# Patient Record
Sex: Male | Born: 1935 | Race: White | Hispanic: No | Smoking: Former smoker
Health system: Southern US, Community
[De-identification: ages and names within clinical notes are randomized; demographics above are authoritative.]

## PROBLEM LIST (undated history)

## (undated) DIAGNOSIS — I10 Essential (primary) hypertension: Secondary | ICD-10-CM

## (undated) DIAGNOSIS — F419 Anxiety disorder, unspecified: Secondary | ICD-10-CM

## (undated) DIAGNOSIS — C189 Malignant neoplasm of colon, unspecified: Secondary | ICD-10-CM

## (undated) DIAGNOSIS — E079 Disorder of thyroid, unspecified: Secondary | ICD-10-CM

## (undated) DIAGNOSIS — R600 Localized edema: Secondary | ICD-10-CM

## (undated) DIAGNOSIS — G4700 Insomnia, unspecified: Secondary | ICD-10-CM

## (undated) DIAGNOSIS — K5790 Diverticulosis of intestine, part unspecified, without perforation or abscess without bleeding: Secondary | ICD-10-CM

## (undated) HISTORY — PX: OTHER SURGICAL HISTORY: SHX169

---

## 2014-04-19 ENCOUNTER — Emergency Department (HOSPITAL_COMMUNITY): Payer: Medicare Other

## 2014-04-19 ENCOUNTER — Encounter (HOSPITAL_COMMUNITY): Payer: Self-pay | Admitting: Emergency Medicine

## 2014-04-19 ENCOUNTER — Emergency Department (HOSPITAL_COMMUNITY)
Admission: EM | Admit: 2014-04-19 | Discharge: 2014-04-19 | Disposition: A | Discharge status: Home / Self Care | Payer: Medicare Other | Attending: Emergency Medicine | Admitting: Emergency Medicine

## 2014-04-19 DIAGNOSIS — Z7982 Long term (current) use of aspirin: Secondary | ICD-10-CM | POA: Insufficient documentation

## 2014-04-19 DIAGNOSIS — Z87891 Personal history of nicotine dependence: Secondary | ICD-10-CM | POA: Insufficient documentation

## 2014-04-19 DIAGNOSIS — R1084 Generalized abdominal pain: Secondary | ICD-10-CM

## 2014-04-19 DIAGNOSIS — R109 Unspecified abdominal pain: Secondary | ICD-10-CM | POA: Diagnosis present

## 2014-04-19 DIAGNOSIS — F411 Generalized anxiety disorder: Secondary | ICD-10-CM | POA: Diagnosis not present

## 2014-04-19 DIAGNOSIS — I10 Essential (primary) hypertension: Secondary | ICD-10-CM | POA: Diagnosis not present

## 2014-04-19 DIAGNOSIS — I714 Abdominal aortic aneurysm, without rupture, unspecified: Secondary | ICD-10-CM | POA: Diagnosis not present

## 2014-04-19 DIAGNOSIS — Z8719 Personal history of other diseases of the digestive system: Secondary | ICD-10-CM | POA: Insufficient documentation

## 2014-04-19 DIAGNOSIS — Z85038 Personal history of other malignant neoplasm of large intestine: Secondary | ICD-10-CM | POA: Diagnosis not present

## 2014-04-19 DIAGNOSIS — E079 Disorder of thyroid, unspecified: Secondary | ICD-10-CM | POA: Insufficient documentation

## 2014-04-19 DIAGNOSIS — Z79899 Other long term (current) drug therapy: Secondary | ICD-10-CM | POA: Diagnosis not present

## 2014-04-19 HISTORY — DX: Disorder of thyroid, unspecified: E07.9

## 2014-04-19 HISTORY — DX: Insomnia, unspecified: G47.00

## 2014-04-19 HISTORY — DX: Essential (primary) hypertension: I10

## 2014-04-19 HISTORY — DX: Malignant neoplasm of colon, unspecified: C18.9

## 2014-04-19 HISTORY — DX: Diverticulosis of intestine, part unspecified, without perforation or abscess without bleeding: K57.90

## 2014-04-19 HISTORY — DX: Localized edema: R60.0

## 2014-04-19 HISTORY — DX: Anxiety disorder, unspecified: F41.9

## 2014-04-19 LAB — COMPREHENSIVE METABOLIC PANEL
ALK PHOS: 89 U/L (ref 39–117)
ALT: 14 U/L (ref 0–53)
AST: 19 U/L (ref 0–37)
Albumin: 3.3 g/dL — ABNORMAL LOW (ref 3.5–5.2)
Anion gap: 10 (ref 5–15)
BUN: 29 mg/dL — ABNORMAL HIGH (ref 6–23)
CHLORIDE: 104 meq/L (ref 96–112)
CO2: 25 mEq/L (ref 19–32)
Calcium: 9.3 mg/dL (ref 8.4–10.5)
Creatinine, Ser: 0.88 mg/dL (ref 0.50–1.35)
GFR calc non Af Amer: 74 mL/min — ABNORMAL LOW (ref >90)
GFR, EST AFRICAN AMERICAN: 86 mL/min — AB (ref >90)
GLUCOSE: 142 mg/dL — AB (ref 70–99)
Potassium: 4.2 mEq/L (ref 3.7–5.3)
Sodium: 139 mEq/L (ref 137–147)
Total Bilirubin: 0.4 mg/dL (ref 0.3–1.2)
Total Protein: 6.8 g/dL (ref 6.0–8.3)

## 2014-04-19 LAB — I-STAT CG4 LACTIC ACID, ED: Lactic Acid, Venous: 1.08 mmol/L (ref 0.5–2.2)

## 2014-04-19 LAB — URINALYSIS, ROUTINE W REFLEX MICROSCOPIC
BILIRUBIN URINE: NEGATIVE
GLUCOSE, UA: NEGATIVE mg/dL
Ketones, ur: NEGATIVE mg/dL
Leukocytes, UA: NEGATIVE
Nitrite: NEGATIVE
PROTEIN: NEGATIVE mg/dL
Specific Gravity, Urine: 1.041 — ABNORMAL HIGH (ref 1.005–1.030)
UROBILINOGEN UA: 0.2 mg/dL (ref 0.0–1.0)
pH: 7 (ref 5.0–8.0)

## 2014-04-19 LAB — CBC WITH DIFFERENTIAL/PLATELET
BASOS PCT: 0 % (ref 0–1)
Basophils Absolute: 0 10*3/uL (ref 0.0–0.1)
EOS PCT: 0 % (ref 0–5)
Eosinophils Absolute: 0 10*3/uL (ref 0.0–0.7)
HCT: 40.2 % (ref 39.0–52.0)
HEMOGLOBIN: 13.3 g/dL (ref 13.0–17.0)
Lymphocytes Relative: 7 % — ABNORMAL LOW (ref 12–46)
Lymphs Abs: 0.9 10*3/uL (ref 0.7–4.0)
MCH: 32 pg (ref 26.0–34.0)
MCHC: 33.1 g/dL (ref 30.0–36.0)
MCV: 96.6 fL (ref 78.0–100.0)
MONO ABS: 0.8 10*3/uL (ref 0.1–1.0)
MONOS PCT: 6 % (ref 3–12)
NEUTROS PCT: 87 % — AB (ref 43–77)
Neutro Abs: 10.9 10*3/uL — ABNORMAL HIGH (ref 1.7–7.7)
Platelets: ADEQUATE 10*3/uL (ref 150–400)
RBC: 4.16 MIL/uL — AB (ref 4.22–5.81)
RDW: 14.5 % (ref 11.5–15.5)
WBC: 12.6 10*3/uL — AB (ref 4.0–10.5)

## 2014-04-19 LAB — URINE MICROSCOPIC-ADD ON

## 2014-04-19 MED ORDER — IOHEXOL 350 MG/ML SOLN
100.0000 mL | Freq: Once | INTRAVENOUS | Status: AC | PRN
  Administered 2014-04-19: 100 mL via INTRAVENOUS

## 2014-04-19 NOTE — ED Notes (Signed)
Per EMS pt states he ate pizza for supper about 6pm and about an hour later he developed lower abd pain   Pt states he had a BM around 2130 with some relief  Pt tried to get himself to vomit but only had dry heaves  Pt is a resident at [facility] has hx of carcinoma of large intestine and diverticulosis

## 2014-04-19 NOTE — ED Notes (Signed)
Lactic Acid given to Dr. Dina Rich.

## 2014-04-19 NOTE — ED Provider Notes (Signed)
CSN: [id number]     Arrival date & time 04/19/14  0225 History   First MD Initiated Contact with Patient 04/19/14 [phone]     Chief Complaint  Patient presents with  . Abdominal Pain     (Consider location/radiation/quality/duration/timing/severity/associated sxs/prior Treatment) HPI  This is an 80 year old male with a history of hypertension, colon cancer, diverticulosis who presents with abdominal pain. Patient reports that he there as usual. He had pizza. Approximately one hour later he developed lower abdominal pain.  He describes as feeling gassy and bloated. Also describes as crampy. It is nonradiating. Currently he reports improvement of pain. He denies any vomiting or diarrhea. He has since had a bowel movement.  Patient reports he was recently diagnosed with a AAA but does not know how big and was supposed to have an ultrasound later today.  Past Medical History  Diagnosis Date  . Hypertension   . Anxiety   . Carcinoma of large intestine   . Diverticulosis   . Edema of foot   . Thyroid disease   . Insomnia    No past surgical history on file. No family history on file. History  Substance Use Topics  . Smoking status: Former Research scientist (life sciences)  . Smokeless tobacco: Not on file  . Alcohol Use: No    Review of Systems  Constitutional: Negative.  Negative for fever.  Respiratory: Negative.  Negative for chest tightness and shortness of breath.   Cardiovascular: Negative.  Negative for chest pain.  Gastrointestinal: Positive for abdominal pain. Negative for nausea, vomiting, diarrhea and constipation.  Genitourinary: Negative.  Negative for dysuria.  Musculoskeletal: Negative for back pain.  Neurological: Negative for headaches.  All other systems reviewed and are negative.     Allergies  Demerol  Home Medications   Prior to Admission medications   Medication Sig Start Date End Date Taking? Authorizing Provider  acetaminophen (TYLENOL) 500 MG tablet Take 1,000 mg by mouth 2  (two) times daily.   Yes Historical Provider, MD  amLODipine (NORVASC) 5 MG tablet Take 5 mg by mouth daily.   Yes Historical Provider, MD  aspirin EC 81 MG tablet Take 81 mg by mouth daily.   Yes Historical Provider, MD  busPIRone (BUSPAR) 10 MG tablet Take 20 mg by mouth every 8 (eight) hours.   Yes Historical Provider, MD  escitalopram (LEXAPRO) 20 MG tablet Take 20 mg by mouth daily.   Yes Historical Provider, MD  levothyroxine (SYNTHROID, LEVOTHROID) 25 MCG tablet Take 25 mcg by mouth daily before breakfast.   Yes Historical Provider, MD  tamsulosin (FLOMAX) 0.4 MG CAPS capsule Take 0.4 mg by mouth daily.   Yes Historical Provider, MD   BP 142/80  Pulse 72  Temp(Src) 98.1 F (36.7 C) (Oral)  Resp 18  SpO2 94% Physical Exam  Nursing note and vitals reviewed. Constitutional: He is oriented to person, place, and time. No distress.  Elderly  HENT:  Head: Normocephalic and atraumatic.  Eyes: Pupils are equal, round, and reactive to light.  Neck: Neck supple.  Cardiovascular: Normal rate, regular rhythm and normal heart sounds.   No murmur heard. Pulmonary/Chest: Effort normal and breath sounds normal. No respiratory distress. He has no wheezes.  Abdominal: Soft. Bowel sounds are normal. There is tenderness. There is no rebound and no guarding.  old midline abdominal scar  Musculoskeletal: He exhibits no edema.  Neurological: He is alert and oriented to person, place, and time.  Skin: Skin is warm and dry.  Psychiatric: He has  a normal mood and affect.    ED Course  Procedures (including critical care time) Labs Review Labs Reviewed  CBC WITH DIFFERENTIAL - Abnormal; Notable for the following:    WBC 12.6 (*)    RBC 4.16 (*)    Neutrophils Relative % 87 (*)    Lymphocytes Relative 7 (*)    Neutro Abs 10.9 (*)    All other components within normal limits  COMPREHENSIVE METABOLIC PANEL - Abnormal; Notable for the following:    Glucose, Bld 142 (*)    BUN 29 (*)    Albumin  3.3 (*)    GFR calc non Af Amer 74 (*)    GFR calc Af Amer 86 (*)    All other components within normal limits  URINALYSIS, ROUTINE W REFLEX MICROSCOPIC - Abnormal; Notable for the following:    Specific Gravity, Urine 1.041 (*)    Hgb urine dipstick TRACE (*)    All other components within normal limits  URINE MICROSCOPIC-ADD ON  I-STAT CG4 LACTIC ACID, ED    Imaging Review Ct Cta Abd/pel W/cm &/or W/o Cm  04/19/2014   CLINICAL DATA:  Abdominal pain. History of abdominal aortic aneurysm.  EXAM: CTA ABDOMEN AND PELVIS wITHOUT AND WITH CONTRAST  TECHNIQUE: Multidetector CT imaging of the abdomen and pelvis was performed using the standard protocol during bolus administration of intravenous contrast. Multiplanar reconstructed images and MIPs were obtained and reviewed to evaluate the vascular anatomy.  CONTRAST:  181mL OMNIPAQUE IOHEXOL 350 MG/ML SOLN  COMPARISON:  None.  FINDINGS: BODY WALL: Unremarkable.  LOWER CHEST: Diffuse coronary atherosclerosis. There is marked elevation of the left diaphragm. The left lower lung is not visualized.  ABDOMEN/PELVIS:  Liver: Low-density areas scattered throughout the liver appear benign/cystic, measuring up to 17 mm in the ventral right liver.  Biliary: Prominent upper common bile duct, measuring up to 13 mm. There is no evidence of calcified stone or acute cholecystitis.  Pancreas: Imaged portions unremarkable  Spleen: Partial visualization due to displacement. Imaged portions unremarkable.  Adrenals: Not definitively visualized on the left. The right adrenal is unremarkable.  Kidneys and ureters: The left kidney is rotated and superiorly displaced. There are multiple cysts within the kidney, including a nearly 10 cm cyst exophytic from the lower pole. No hydronephrosis. Multiple right renal cysts as well.  Bladder: Moderate distension of the bladder, which is deviated to the right by lipomatosis mass.  Reproductive: Enlarged prostate with heterogeneous nodular  enhancement. Findings are most suggestive of hypertrophy.  Bowel: The hepatic flexure is not entirely visualized due to displacement. There has been right hemicolectomy reportedly for colon cancer. No evidence of bowel obstruction. Extensive distal colonic diverticulosis. The sigmoid colon and transverse colon are displaced body lipomatosis mass. Appendix is surgically absent.  Retroperitoneum: There is a large fatty mass extending from the level of the imaged left upper quadrant (not entirely visualized due to displaced by the diaphragm) into the pelvis. There is displacement of vessels and internal and septations and occasional nodularity. Fat haziness present in the pelvis, left of the bladder, consistent with soft tissue components. There is likely contralateral extension in the right lower quadrant.  Peritoneum: No ascites or pneumoperitoneum.  Vascular: Juxtarenal abdominal aortic aneurysm which measures up to 48 mm maximal transverse diameter, by 43 mm AP dimension. There is extensive mural thrombus and mural atherosclerotic calcification with probable multiple chronic dissections based on displaced intimal calcification. There is a web-like, complex, moderate to high-grade stenosis involving the proximal left  common iliac artery which is enlarged at 16 mm. Along the posterior margin of the aneurysm is a crescent of high density. There is no draping of the aorta or rupture.  OSSEOUS: L3 compression fracture with greater than 75% height loss. There is moderate retropulsion with canal narrowing. The fracture is unhealed.  Review of the MIP images confirms the above findings.  IMPRESSION: 1. 4.8 cm non-ruptured juxtarenal aortic aneurysm. High-density in the posterior aneurysm wall could be mural thickening or intramural hematoma. 2. Extensive retroperitoneal liposarcoma. 3. L3 compression fracture with progressive height loss and retropulsion from 03/15/2014. 4. Multiple incidental findings noted above.    Electronically Signed   By: Jorje Guild M.D.   On: 04/19/2014 05:54     EKG Interpretation None      MDM   Final diagnoses:  Generalized abdominal pain  AAA (abdominal aortic aneurysm) without rupture    Patient presents with postprandial abdominal pain. Currently is comfortable. Vital signs are reassuring. No evidence of peritonitis on exam. Patient reports also a history of AAA for which he is supposed to have her ultrasound later today. Basic labwork obtained. Lab work is largely reassuring including normal lactate. However, given postprandial pain and AAA, we'll get a CTA to evaluate for both mesenteric ischemia and AAA. Patient has a 4.8 cm nonruptured AAA.  No evidence of mesenteric artery occlusion. Patient continues to have a benign exam and denies any pain. Given reassuring workup, feel patient can be discharged home. He is followup with his vascular surgeon later today. Patient was given CT report. I encouraged the patient to call his vascular surgeon prior to obtaining ultrasound later today as he has had a CT scan. Patient stated understanding.  After history, exam, and medical workup I feel the patient has been appropriately medically screened and is safe for discharge home. Pertinent diagnoses were discussed with the patient. Patient was given return precautions.     Merryl Hacker, MD 04/19/14 [phone]

## 2014-04-19 NOTE — ED Notes (Signed)
Pt arrived to the ED with a complaint of abdominal pain located midline medial.  Pt states the pain began yesterday after eating pizza.  Pt states that he usually doesn't eat pizza.  Pt denies emesis but does state he had dry heaves.  Pt doesn't have nausea but slight abdominal pain that was relieved after he took Mylanta and a sprite.

## 2014-04-19 NOTE — Discharge Instructions (Signed)
Abdominal Aortic Aneurysm An aneurysm is a weakened or damaged part of an artery wall that bulges from the normal force of blood pumping through the body. An abdominal aortic aneurysm is an aneurysm that occurs in the lower part of the aorta, the main artery of the body.  The major concern with an abdominal aortic aneurysm is that it can enlarge and burst (rupture) or blood can flow between the layers of the wall of the aorta through a tear (aorticdissection). Both of these conditions can cause bleeding inside the body and can be life threatening unless diagnosed and treated promptly. CAUSES  The exact cause of an abdominal aortic aneurysm is unknown. Some contributing factors are:   A hardening of the arteries caused by the buildup of fat and other substances in the lining of a blood vessel (arteriosclerosis).  Inflammation of the walls of an artery (arteritis).   Connective tissue diseases, such as Marfan syndrome.   Abdominal trauma.   An infection, such as syphilis or staphylococcus, in the wall of the aorta (infectious aortitis) caused by bacteria. RISK FACTORS  Risk factors that contribute to an abdominal aortic aneurysm may include:  Age older than 60 years.   High blood pressure (hypertension).  Male gender.  Ethnicity (white race).  Obesity.  Family history of aneurysm (first degree relatives only).  Tobacco use. PREVENTION  The following healthy lifestyle habits may help decrease your risk of abdominal aortic aneurysm:  Quitting smoking. Smoking can raise your blood pressure and cause arteriosclerosis.  Limiting or avoiding alcohol.  Keeping your blood pressure, blood sugar level, and cholesterol levels within normal limits.  Decreasing your salt intake. In somepeople, too much salt can raise blood pressure and increase your risk of abdominal aortic aneurysm.  Eating a diet low in saturated fats and cholesterol.  Increasing your fiber intake by including  whole grains, vegetables, and fruits in your diet. Eating these foods may help lower blood pressure.  Maintaining a healthy weight.  Staying physically active and exercising regularly. SYMPTOMS  The symptoms of abdominal aortic aneurysm may vary depending on the size and rate of growth of the aneurysm.Most grow slowly and do not have any symptoms. When symptoms do occur, they may include:  Pain (abdomen, side, lower back, or groin). The pain may vary in intensity. A sudden onset of severe pain may indicate that the aneurysm has ruptured.  Feeling full after eating only small amounts of food.  Nausea or vomiting or both.  Feeling a pulsating lump in the abdomen.  Feeling faint or passing out. DIAGNOSIS  Since most unruptured abdominal aortic aneurysms have no symptoms, they are often discovered during diagnostic exams for other conditions. An aneurysm may be found during the following procedures:  Ultrasonography (A one-time screening for abdominal aortic aneurysm by ultrasonography is also recommended for all men aged 65-75 years who have ever smoked).  X-ray exams.  A computed tomography (CT).  Magnetic resonance imaging (MRI).  Angiography or arteriography. TREATMENT  Treatment of an abdominal aortic aneurysm depends on the size of your aneurysm, your age, and risk factors for rupture. Medication to control blood pressure and pain may be used to manage aneurysms smaller than 6 cm. Regular monitoring for enlargement may be recommended by your caregiver if:  The aneurysm is 3-4 cm in size (an annual ultrasonography may be recommended).  The aneurysm is 4-4.5 cm in size (an ultrasonography every 6 months may be recommended).  The aneurysm is larger than 4.5 cm in   size (your caregiver may ask that you be examined by a vascular surgeon). If your aneurysm is larger than 6 cm, surgical repair may be recommended. There are two main methods for repair of an aneurysm:   Endovascular  repair (a minimally invasive surgery). This is done most often.  Open repair. This method is used if an endovascular repair is not possible. Document Released: 04/23/2005 Document Revised: 11/08/2012 Document Reviewed: 08/13/2012 Exit[employer] Patient Information 2015 [employer]. This information is not intended to replace advice given to you by your health care provider. Make sure you discuss any questions you have with your health care provider.  

## 2014-06-23 ENCOUNTER — Emergency Department (HOSPITAL_COMMUNITY): Payer: Medicare Other

## 2014-06-23 ENCOUNTER — Encounter (HOSPITAL_COMMUNITY): Payer: Self-pay | Admitting: Emergency Medicine

## 2014-06-23 ENCOUNTER — Inpatient Hospital Stay (HOSPITAL_COMMUNITY)
Admission: EM | Admit: 2014-06-23 | Discharge: 2014-06-26 | DRG: 071 | Disposition: A | Discharge status: Home / Self Care | Payer: Medicare Other | Attending: Internal Medicine | Admitting: Internal Medicine

## 2014-06-23 DIAGNOSIS — R410 Disorientation, unspecified: Secondary | ICD-10-CM | POA: Diagnosis present

## 2014-06-23 DIAGNOSIS — F329 Major depressive disorder, single episode, unspecified: Secondary | ICD-10-CM

## 2014-06-23 DIAGNOSIS — G9340 Encephalopathy, unspecified: Secondary | ICD-10-CM | POA: Diagnosis present

## 2014-06-23 DIAGNOSIS — F419 Anxiety disorder, unspecified: Secondary | ICD-10-CM | POA: Diagnosis present

## 2014-06-23 DIAGNOSIS — R630 Anorexia: Secondary | ICD-10-CM | POA: Diagnosis not present

## 2014-06-23 DIAGNOSIS — Z888 Allergy status to other drugs, medicaments and biological substances status: Secondary | ICD-10-CM

## 2014-06-23 DIAGNOSIS — C480 Malignant neoplasm of retroperitoneum: Secondary | ICD-10-CM | POA: Diagnosis not present

## 2014-06-23 DIAGNOSIS — F0390 Unspecified dementia without behavioral disturbance: Secondary | ICD-10-CM | POA: Diagnosis present

## 2014-06-23 DIAGNOSIS — Z9049 Acquired absence of other specified parts of digestive tract: Secondary | ICD-10-CM | POA: Diagnosis present

## 2014-06-23 DIAGNOSIS — K5790 Diverticulosis of intestine, part unspecified, without perforation or abscess without bleeding: Secondary | ICD-10-CM | POA: Diagnosis not present

## 2014-06-23 DIAGNOSIS — G4700 Insomnia, unspecified: Secondary | ICD-10-CM | POA: Diagnosis present

## 2014-06-23 DIAGNOSIS — Z681 Body mass index (BMI) 19 or less, adult: Secondary | ICD-10-CM | POA: Diagnosis not present

## 2014-06-23 DIAGNOSIS — Z7982 Long term (current) use of aspirin: Secondary | ICD-10-CM | POA: Diagnosis not present

## 2014-06-23 DIAGNOSIS — E039 Hypothyroidism, unspecified: Secondary | ICD-10-CM | POA: Diagnosis not present

## 2014-06-23 DIAGNOSIS — Z515 Encounter for palliative care: Secondary | ICD-10-CM | POA: Diagnosis not present

## 2014-06-23 DIAGNOSIS — R4182 Altered mental status, unspecified: Secondary | ICD-10-CM | POA: Diagnosis present

## 2014-06-23 DIAGNOSIS — I10 Essential (primary) hypertension: Secondary | ICD-10-CM | POA: Diagnosis not present

## 2014-06-23 DIAGNOSIS — F32A Depression, unspecified: Secondary | ICD-10-CM | POA: Insufficient documentation

## 2014-06-23 DIAGNOSIS — Z66 Do not resuscitate: Secondary | ICD-10-CM | POA: Diagnosis present

## 2014-06-23 LAB — CBC WITH DIFFERENTIAL/PLATELET
Basophils Absolute: 0 10*3/uL (ref 0.0–0.1)
Basophils Relative: 0 % (ref 0–1)
Eosinophils Absolute: 0.1 10*3/uL (ref 0.0–0.7)
Eosinophils Relative: 1 % (ref 0–5)
HCT: 37.9 % — ABNORMAL LOW (ref 39.0–52.0)
HEMOGLOBIN: 12.3 g/dL — AB (ref 13.0–17.0)
LYMPHS ABS: 1.1 10*3/uL (ref 0.7–4.0)
Lymphocytes Relative: 14 % (ref 12–46)
MCH: 31.8 pg (ref 26.0–34.0)
MCHC: 32.5 g/dL (ref 30.0–36.0)
MCV: 97.9 fL (ref 78.0–100.0)
MONO ABS: 0.8 10*3/uL (ref 0.1–1.0)
MONOS PCT: 10 % (ref 3–12)
NEUTROS ABS: 5.8 10*3/uL (ref 1.7–7.7)
Neutrophils Relative %: 75 % (ref 43–77)
Platelets: 418 10*3/uL — ABNORMAL HIGH (ref 150–400)
RBC: 3.87 MIL/uL — AB (ref 4.22–5.81)
RDW: 14.1 % (ref 11.5–15.5)
WBC: 7.8 10*3/uL (ref 4.0–10.5)

## 2014-06-23 LAB — COMPREHENSIVE METABOLIC PANEL
ALBUMIN: 2.9 g/dL — AB (ref 3.5–5.2)
ALK PHOS: 100 U/L (ref 39–117)
ALT: 21 U/L (ref 0–53)
AST: 25 U/L (ref 0–37)
Anion gap: 9 (ref 5–15)
BILIRUBIN TOTAL: 0.3 mg/dL (ref 0.3–1.2)
BUN: 23 mg/dL (ref 6–23)
CHLORIDE: 101 meq/L (ref 96–112)
CO2: 29 mEq/L (ref 19–32)
CREATININE: 0.85 mg/dL (ref 0.50–1.35)
Calcium: 8.9 mg/dL (ref 8.4–10.5)
GFR, EST AFRICAN AMERICAN: 88 mL/min — AB (ref >90)
GFR, EST NON AFRICAN AMERICAN: 76 mL/min — AB (ref >90)
GLUCOSE: 98 mg/dL (ref 70–99)
POTASSIUM: 4 meq/L (ref 3.7–5.3)
Sodium: 139 mEq/L (ref 137–147)
Total Protein: 6.6 g/dL (ref 6.0–8.3)

## 2014-06-23 LAB — URINALYSIS, ROUTINE W REFLEX MICROSCOPIC
BILIRUBIN URINE: NEGATIVE
Glucose, UA: NEGATIVE mg/dL
HGB URINE DIPSTICK: NEGATIVE
KETONES UR: NEGATIVE mg/dL
LEUKOCYTES UA: NEGATIVE
Nitrite: NEGATIVE
PH: 7 (ref 5.0–8.0)
PROTEIN: NEGATIVE mg/dL
Specific Gravity, Urine: 1.02 (ref 1.005–1.030)
Urobilinogen, UA: 1 mg/dL (ref 0.0–1.0)

## 2014-06-23 LAB — I-STAT TROPONIN, ED: TROPONIN I, POC: 0.01 ng/mL (ref 0.00–0.08)

## 2014-06-23 LAB — AMMONIA: AMMONIA: 21 umol/L (ref 11–60)

## 2014-06-23 LAB — I-STAT CG4 LACTIC ACID, ED: Lactic Acid, Venous: 0.56 mmol/L (ref 0.5–2.2)

## 2014-06-23 LAB — TSH: TSH: 0.924 u[IU]/mL (ref 0.350–4.500)

## 2014-06-23 MED ORDER — MIRTAZAPINE 7.5 MG PO TABS
7.5000 mg | ORAL_TABLET | Freq: Every day | ORAL | Status: DC
  Administered 2014-06-23 – 2014-06-25 (×3): 7.5 mg via ORAL
  Filled 2014-06-23 (×4): qty 1

## 2014-06-23 MED ORDER — SENNA 8.6 MG PO TABS: 8.6000 mg | ORAL_TABLET | Freq: Every day | ORAL | Status: DC | PRN

## 2014-06-23 MED ORDER — TAMSULOSIN HCL 0.4 MG PO CAPS
0.4000 mg | ORAL_CAPSULE | Freq: Every day | ORAL | Status: DC
  Administered 2014-06-23 – 2014-06-26 (×3): 0.4 mg via ORAL
  Filled 2014-06-23 (×4): qty 1

## 2014-06-23 MED ORDER — ESCITALOPRAM OXALATE 20 MG PO TABS
20.0000 mg | ORAL_TABLET | Freq: Every day | ORAL | Status: DC
  Administered 2014-06-23 – 2014-06-26 (×3): 20 mg via ORAL
  Filled 2014-06-23 (×4): qty 1

## 2014-06-23 MED ORDER — AMLODIPINE BESYLATE 5 MG PO TABS
5.0000 mg | ORAL_TABLET | Freq: Every day | ORAL | Status: DC
  Administered 2014-06-23 – 2014-06-26 (×3): 5 mg via ORAL
  Filled 2014-06-23 (×4): qty 1

## 2014-06-23 MED ORDER — SODIUM CHLORIDE 0.9 % IV SOLN
Freq: Once | INTRAVENOUS | Status: AC
  Administered 2014-06-23: 04:00:00 via INTRAVENOUS

## 2014-06-23 MED ORDER — SODIUM CHLORIDE 0.9 % IV BOLUS (SEPSIS)
1000.0000 mL | Freq: Once | INTRAVENOUS | Status: AC
  Administered 2014-06-23: 1000 mL via INTRAVENOUS

## 2014-06-23 MED ORDER — SODIUM CHLORIDE 0.9 % IV SOLN
INTRAVENOUS | Status: AC
  Administered 2014-06-23: 05:00:00 via INTRAVENOUS

## 2014-06-23 MED ORDER — ONDANSETRON HCL 4 MG/2ML IJ SOLN: 4.0000 mg | Freq: Four times a day (QID) | INTRAMUSCULAR | Status: DC | PRN

## 2014-06-23 MED ORDER — ACETAMINOPHEN 325 MG PO TABS: 650.0000 mg | ORAL_TABLET | Freq: Four times a day (QID) | ORAL | Status: DC | PRN

## 2014-06-23 MED ORDER — ENSURE PLUS PO LIQD: 237.0000 mL | Freq: Two times a day (BID) | ORAL | Status: DC

## 2014-06-23 MED ORDER — LEVOTHYROXINE SODIUM 25 MCG PO TABS
25.0000 ug | ORAL_TABLET | Freq: Every day | ORAL | Status: DC
  Administered 2014-06-23 – 2014-06-26 (×3): 25 ug via ORAL
  Filled 2014-06-23 (×5): qty 1

## 2014-06-23 MED ORDER — BUSPIRONE HCL 10 MG PO TABS
20.0000 mg | ORAL_TABLET | Freq: Three times a day (TID) | ORAL | Status: DC
Start: 2014-06-23 — End: 2014-06-26
  Administered 2014-06-23 – 2014-06-25 (×5): 20 mg via ORAL
  Filled 2014-06-23 (×14): qty 2

## 2014-06-23 MED ORDER — SODIUM CHLORIDE 0.9 % IV SOLN
INTRAVENOUS | Status: AC
  Administered 2014-06-23: 20:00:00 via INTRAVENOUS

## 2014-06-23 MED ORDER — ACETAMINOPHEN 650 MG RE SUPP: 650.0000 mg | Freq: Four times a day (QID) | RECTAL | Status: DC | PRN

## 2014-06-23 MED ORDER — ENOXAPARIN SODIUM 40 MG/0.4ML ~~LOC~~ SOLN
40.0000 mg | SUBCUTANEOUS | Status: DC
  Administered 2014-06-23 – 2014-06-26 (×3): 40 mg via SUBCUTANEOUS
  Filled 2014-06-23 (×5): qty 0.4

## 2014-06-23 MED ORDER — ONDANSETRON HCL 4 MG PO TABS: 4.0000 mg | ORAL_TABLET | Freq: Four times a day (QID) | ORAL | Status: DC | PRN

## 2014-06-23 MED ORDER — TRAMADOL HCL 50 MG PO TABS
50.0000 mg | ORAL_TABLET | Freq: Four times a day (QID) | ORAL | Status: DC | PRN
  Administered 2014-06-23 – 2014-06-25 (×2): 50 mg via ORAL
  Filled 2014-06-23 (×2): qty 1

## 2014-06-23 MED ORDER — ASPIRIN EC 81 MG PO TBEC
81.0000 mg | DELAYED_RELEASE_TABLET | Freq: Every day | ORAL | Status: DC
  Administered 2014-06-23 – 2014-06-26 (×3): 81 mg via ORAL
  Filled 2014-06-23 (×4): qty 1

## 2014-06-23 MED ORDER — ENSURE COMPLETE PO LIQD
237.0000 mL | Freq: Two times a day (BID) | ORAL | Status: DC
  Administered 2014-06-23 – 2014-06-26 (×4): 237 mL via ORAL

## 2014-06-23 NOTE — ED Provider Notes (Signed)
CSN: [id number]     Arrival date & time 06/23/14  0022 History   First MD Initiated Contact with Patient 06/23/14 0029     Chief Complaint  Patient presents with  . Altered Mental Status  . Failure To Thrive     (Consider location/radiation/quality/duration/timing/severity/associated sxs/prior Treatment) HPI Ricky Todd is a 80 y.o. male with history of hypertension, anxiety, colon cancer which patient is not aware of (family decided to keep a secret), thyroid disease, presents to emergency department brought in by his family for altered mental status. According to family, patient has had progressive decline in his activity level and mental status. Today however patient's sister states that he is more confused than usual, has a new tremor, and just not acting like himself. Episodes are intermittent throughout the day. Patient did have many stressors recently, his wife was admitted to nursing home yesterday. Patient currently lives in independent living facility. Family denies any recent illnesses. Patient has no complaint. Family states the patient was saying that something was wrong earlier, but he could not explain what it was, and he was asking for help.  Past Medical History  Diagnosis Date  . Hypertension   . Anxiety   . Carcinoma of large intestine   . Diverticulosis   . Edema of foot   . Thyroid disease   . Insomnia    History reviewed. No pertinent past surgical history. No family history on file. History  Substance Use Topics  . Smoking status: Former Research scientist (life sciences)  . Smokeless tobacco: Not on file  . Alcohol Use: No    Review of Systems  Unable to perform ROS: Mental status change      Allergies  Demerol  Home Medications   Prior to Admission medications   Medication Sig Start Date End Date Taking? Authorizing Provider  acetaminophen (TYLENOL) 500 MG tablet Take 1,000 mg by mouth 2 (two) times daily.   Yes Historical Provider, MD  amLODipine (NORVASC) 5 MG tablet  Take 5 mg by mouth daily.   Yes Historical Provider, MD  busPIRone (BUSPAR) 10 MG tablet Take 20 mg by mouth every 8 (eight) hours.   Yes Historical Provider, MD  ENSURE PLUS (ENSURE PLUS) LIQD Take 237 mLs by mouth 2 (two) times daily between meals.   Yes Historical Provider, MD  escitalopram (LEXAPRO) 20 MG tablet Take 20 mg by mouth daily.   Yes Historical Provider, MD  levothyroxine (SYNTHROID, LEVOTHROID) 25 MCG tablet Take 25 mcg by mouth daily before breakfast.   Yes Historical Provider, MD  senna (SENOKOT) 8.6 MG tablet Take 1 tablet by mouth daily as needed for constipation.   Yes Historical Provider, MD  tamsulosin (FLOMAX) 0.4 MG CAPS capsule Take 0.4 mg by mouth daily.   Yes Historical Provider, MD  traMADol (ULTRAM) 50 MG tablet Take 50 mg by mouth 4 (four) times daily as needed for moderate pain.   Yes Historical Provider, MD  aspirin EC 81 MG tablet Take 81 mg by mouth daily.    Historical Provider, MD   BP 140/88 mmHg  Pulse 89  Temp(Src) 98.4 F (36.9 C) (Rectal)  Resp 22  SpO2 94% Physical Exam  Constitutional: He appears well-developed and well-nourished. No distress.  HENT:  Head: Normocephalic and atraumatic.  Oral mucosa dry  Eyes:  Pupils are pinpoint, purulent bilateral eye drainage noted.  Neck: Neck supple.  Cardiovascular: Normal rate and regular rhythm.   Murmur heard. Pulmonary/Chest: Effort normal. No respiratory distress. He has no wheezes. He  has no rales.  Abdominal: Soft. Bowel sounds are normal. He exhibits no distension. There is no tenderness. There is no rebound.  Musculoskeletal: He exhibits no edema.  Neurological: He is alert. No cranial nerve deficit. Coordination normal.  Oriented to self and place. 5/5 and equal upper and lower extremity strength bilaterally. Equal grip strength bilaterally. Normal finger to nose and heel to shin. No pronator drift.  Skin: Skin is warm and dry.  Nursing note and vitals reviewed.   ED Course  Procedures  (including critical care time) Labs Review Labs Reviewed  CBC WITH DIFFERENTIAL - Abnormal; Notable for the following:    RBC 3.87 (*)    Hemoglobin 12.3 (*)    HCT 37.9 (*)    Platelets 418 (*)    All other components within normal limits  COMPREHENSIVE METABOLIC PANEL - Abnormal; Notable for the following:    Albumin 2.9 (*)    GFR calc non Af Amer 76 (*)    GFR calc Af Amer 88 (*)    All other components within normal limits  URINALYSIS, ROUTINE W REFLEX MICROSCOPIC - Abnormal; Notable for the following:    APPearance CLOUDY (*)    All other components within normal limits  URINE MICROSCOPIC-ADD ON  I-STAT TROPOININ, ED  I-STAT CG4 LACTIC ACID, ED    Imaging Review Dg Chest 2 View  06/23/2014   CLINICAL DATA:  Altered mental status and shortness of breath.  EXAM: CHEST  2 VIEW  COMPARISON:  12/11/2008  FINDINGS: Although there is cardiac prominence, no cardiomegaly when correlating with previous CT imaging, rather the heart is displaced to the right. The aorta is tortuous. There is mass effect on the trachea, displaced to the left. This is a chronic finding, stable from 2010. And is likely from ectatic vasculature, less likely from thyroid nodule.  There is chronic, marked elevation of the left diaphragm, exacerbated by a large liposarcoma noted on previous abdominal CT. No evidence for edema, effusion, pneumonia, or pneumothorax.  IMPRESSION: 1. No acute findings. 2. Chronic, marked elevation of the left diaphragm. Elevation is exacerbated by a large retroperitoneal liposarcoma.   Electronically Signed   By: Jorje Guild M.D.   On: 06/23/2014 01:23   Ct Head Wo Contrast  06/23/2014   CLINICAL DATA:  Increasing confusion, history of hypertension. Colon cancer.  EXAM: CT HEAD WITHOUT CONTRAST  TECHNIQUE: Contiguous axial images were obtained from the base of the skull through the vertex without intravenous contrast.  COMPARISON:  None.  FINDINGS: The ventricles and sulci are normal  for age. No intraparenchymal hemorrhage, mass effect nor midline shift. Patchy supratentorial white matter hypodensities are less than expected for patient's age and though non-specific suggest sequelae of chronic small vessel ischemic disease. No acute large vascular territory infarcts. Patchy hypodensities in the bilateral basal ganglia likely reflect perivascular spaces and or lacunar infarcts.  No abnormal extra-axial fluid collections. Basal cisterns are patent. Moderate calcific atherosclerosis of the carotid siphons.  No skull fracture. The included ocular globes and orbital contents are non-suspicious. Status post bilateral ocular lens implants. Soft tissue completely opacifies the LEFT maxilla with chronic bony wall thickening. LEFT frontal ethmoidal sinusitis. LEFT maxillary periapical abscess.  IMPRESSION: No acute intracranial process.  Involutional changes. Mild white matter changes, less than expected for age. Bilateral basal ganglia perivascular spaces and/or lacunar infarcts.  LEFT paranasal sinusitis, considering LEFT maxillary periapical abscess, this may be in part odontogenic in origin.   Electronically Signed   By: Sandie Ano  Bloomer   On: 06/23/2014 01:37     EKG Interpretation   Date/Time:  Friday June 23 2014 00:30:36 EST Ventricular Rate:  87 PR Interval:  170 QRS Duration: 71 QT Interval:  392 QTC Calculation: 472 R Axis:   69 Text Interpretation:  Sinus rhythm minimal ST elevation throughout, more  pronounced in inferior leads and aVF No prior for comparision Confirmed by  DOCHERTY  MD, MEGAN ([id number]) on 06/23/2014 1:08:15 AM      MDM   Final diagnoses:  Altered mental state      Pt with altered mental status, worsening today. There is a question of possible colon ca? That pt is not aware of, family chose to keep it from him. No current treatment planned.  Pt oriented x2 only. Neurological exam normal. No pain. VS normal. Pt afebrile. Will get labs, CT head,  CXR. UA.   Pt's labs and imaging unremarkable. Pt appears very dry. He received IV fluids in ED. He continues to have some confusion. Family concerned because this is far away from his baseline. Discussed with [facility] hospitalist. Will admit. VS normal at this time.   Filed Vitals:   06/23/14 0026 06/23/14 0200 06/23/14 0300 06/23/14 0330  BP: 140/88 156/94 127/80 138/92  Pulse: 89 91 85 91  Temp: 98.4 F (36.9 C)     TempSrc: Rectal     Resp: 22 13 19 15   SpO2: 94% 95% 96% 93%     Renold Genta, PA-C 06/23/14 0457  Ernestina Patches, MD 06/23/14 7341

## 2014-06-23 NOTE — Progress Notes (Signed)
INITIAL NUTRITION ASSESSMENT  DOCUMENTATION CODES Per approved criteria  -Severe malnutrition in the context of chronic illness   INTERVENTION: 1.  General healthful diet; encourage intake of foods and beverages as able.  RD to follow and assess for nutritional adequacy.  2.  Supplements; Ensure Complete po BID, each supplement provides 350 kcal and 13 grams of protein  NUTRITION DIAGNOSIS: Inadequate oral intake related to depression as evidenced by pt/family report.   Monitor:  1.  Food/Beverage; pt meeting >/=90% estimated needs with tolerance. 2.  Wt/wt change; monitor trends  Reason for Assessment: MST  80 y.o. male  Admitting Dx: Acute encephalopathy  ASSESSMENT: Pt admitted with acute encephalopathy.  Family noted recent depression since his wife moved to NH. RD met with patient at bedside.  MS has improved- able to have a conversation about nutrition and weight status.  Patient reports that his usual weight is 130lbs.  States he has lost weight- unsure of reason why.  He states his PCP recommended Ensure at home as well as protein bars.  Patient states he has been drinking 2.5 Ensures per day.  Family not at bedside to discuss nutrition.  Patient looking at menu during visit.  RD notes patient has tray ordered.  Nutrition Focused Physical Exam: Subcutaneous Fat:  Orbital Region: mild wasting Upper Arm Region: severe wasting Thoracic and Lumbar Region: severe wasting  Muscle:  Temple Region: moderate wasting Clavicle Bone Region: severew Financial risk analyst and Acromion Bone Region: severe wasting Scapular Bone Region: moderate wasting Dorsal Hand: severe wasting Patellar Region: not asessed Anterior Thigh Region: not assessed Posterior Calf Region: not assessed  Edema: none  Height: Ht Readings from Last 1 Encounters:  06/23/14 $RemoveB'5\' 5"'wBYvoNTy$  (1.651 m)    Weight: Wt Readings from Last 1 Encounters:  06/23/14 120 lb (54.432 kg)    Ideal Body Weight: 136 lbs  %  Ideal Body Weight: 88%  Wt Readings from Last 10 Encounters:  06/23/14 120 lb (54.432 kg)    Usual Body Weight: 130 lbs per pt  % Usual Body Weight: 85-90%  BMI:  Body mass index is 19.97 kg/(m^2).  Estimated Nutritional Needs: Kcal: 1700-1850 Protein: 60-70g Fluid: >1.8 L/day  Skin: intact  Diet Order: Diet Heart  EDUCATION NEEDS: -Education needs addressed   Intake/Output Summary (Last 24 hours) at 06/23/14 1215 Last data filed at 06/23/14 0911  Gross per 24 hour  Intake    400 ml  Output    100 ml  Net    300 ml    Last BM: PTA  Labs:   Recent Labs Lab 06/23/14 0147  NA 139  K 4.0  CL 101  CO2 29  BUN 23  CREATININE 0.85  CALCIUM 8.9  GLUCOSE 98    CBG (last 3)  No results for input(s): GLUCAP in the last 72 hours.  Scheduled Meds: . sodium chloride   Intravenous STAT  . amLODipine  5 mg Oral Daily  . aspirin EC  81 mg Oral Daily  . busPIRone  20 mg Oral 3 times per day  . enoxaparin (LOVENOX) injection  40 mg Subcutaneous Q24H  . escitalopram  20 mg Oral Daily  . feeding supplement (ENSURE COMPLETE)  237 mL Oral BID BM  . levothyroxine  25 mcg Oral QAC breakfast  . tamsulosin  0.4 mg Oral Daily    Continuous Infusions: . sodium chloride 75 mL/hr at 06/23/14 0700    Past Medical History  Diagnosis Date  . Hypertension   .  Anxiety   . Carcinoma of large intestine   . Diverticulosis   . Edema of foot   . Thyroid disease   . Insomnia     Past Surgical History  Procedure Laterality Date  . Colon surgery      Ricky Greathouse, MS RD LDN Clinical Inpatient Dietitian Weekend/After hours pager: [phone]

## 2014-06-23 NOTE — ED Notes (Signed)
Bed: EZ50 Expected date:  Expected time:  Means of arrival:  Comments: EMS/[facility]/lethargic per family-decreased po intake

## 2014-06-23 NOTE — Consult Note (Signed)
Patient Ricky Todd      DOB: 05-Jun-1924      FXT:[id number]     Consult Note from the Palliative Medicine Team at [facility] Requested by: Dr Hal Hope     PCP: No primary care provider on file. Reason for Consultation: Goals of Care    Phone Number:None  Assessment/Recommendations: 79 yo male with PMHx of HTN, hypothyroid who presented from independent living retirement community with progressive functional decline and increasing confusion.  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   1.  Code Status: DNR  2. Goals of Care: Family has noted progressive functional decline and cognitive decline which has more rapidly progressed since August.  Was previously able to drive independently.  Most recently in independent living section of retirement community, but not doing well.  Having more trouble walking, increased confusion, waxing/weaning mentation.  Also had imaging evidence of liposarcoma which family decided not to pursue. They talked with case manager today and are trying to get him to nursing home where his wife resides.  They have already spoken to physician at this nursing home and were actually waiting on completed paperwork before going there on 11/30.    It is clear that they want to limit repeat hospitalizations, tests, labs as able.  They do not want to forgo this care completely, but judiciously use medical interventions.  They are hoping he does a bit better with oversight at nursing home but I think are aware he is 34 and hope to avoid aggressive medical interventions. Hence decision for DNR.  They have asked about filling out advance directives and I have consulted spiritual care to help them complete.  I think that questions over when/if to come back to hospital will depend on how he is doing and what is going on at that time. Ideally this will happen with nursing home physician that gets to know him  and help them explore evolving goals.  He would not likley be hospice eligible (not sure his prognosis is <6 months, insurance coverage of both SNF and hospice) at this time. Would be reasonable to engage outpatient palliative care service to help family work through these issues however.    3. Symptom Management: 1. Depression/loss of appetite/ insomnia- on buspar and lexapro. Will add low dose mirtazapine to address other issues.  With low dose, I think there is low likelihood of drug interactions.    4. Psychosocial/Spiritual: Working on nursing home placement where his wife currently is.  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  Loves to watch basketball, especially [place].    Brief HPI: 80 yo male with PMHx of Htn, hypothyroid, h/o colon CA who presented from home with progressively worsening confusion over past several months and decreased functional decline. Working toward nursing home placement.  Palliative consulted for goals of care.  Ricky Todd states he is feeling better, but family notes continued waxing/waning mentation. Has been worsening over past several months.  Ricky Todd feels like thinking clearer. Recognizes he has not been doing well at home. Admits being depressed, having poor appetite, not sleeping well.  Not as interested in doing things he likes. No nausea, just not hungry. Has history of compression fracture in spine with associated pain, but not bothersome currently.  No other acute complaints. He is unsure if he has been losing weight. No other recent hospitalizations.    PMH:  Past Medical History  Diagnosis Date  . Hypertension   .  Anxiety   . Carcinoma of large intestine   . Diverticulosis   . Edema of foot   . Thyroid disease   . Insomnia      PSH: Past Surgical History  Procedure Laterality Date  . Colon surgery     I have reviewed the FH and SH and  If appropriate update it with new information. Allergies  Allergen Reactions  . Demerol [Meperidine] Other (See  Comments)    unknown   Scheduled Meds: . sodium chloride   Intravenous STAT  . amLODipine  5 mg Oral Daily  . aspirin EC  81 mg Oral Daily  . busPIRone  20 mg Oral 3 times per day  . enoxaparin (LOVENOX) injection  40 mg Subcutaneous Q24H  . escitalopram  20 mg Oral Daily  . feeding supplement (ENSURE COMPLETE)  237 mL Oral BID BM  . levothyroxine  25 mcg Oral QAC breakfast  . tamsulosin  0.4 mg Oral Daily   Continuous Infusions: . sodium chloride 75 mL/hr at 06/23/14 0700   PRN Meds:.acetaminophen **OR** acetaminophen, ondansetron **OR** ondansetron (ZOFRAN) IV, senna, traMADol    BP 126/79 mmHg  Pulse 82  Temp(Src) 98.7 F (37.1 C) (Oral)  Resp 20  Ht 5\' 5"  (1.651 m)  Wt 54.432 kg (120 lb)  BMI 19.97 kg/m2  SpO2 96%   PPS: 50   Intake/Output Summary (Last 24 hours) at 06/23/14 1612 Last data filed at 06/23/14 1433  Gross per 24 hour  Intake    640 ml  Output    100 ml  Net    540 ml    Physical Exam:  General: Alert, NAD Neuro: oriented x3, follows commands   Labs: CBC    Component Value Date/Time   WBC 7.8 06/23/2014 0147   RBC 3.87* 06/23/2014 0147   HGB 12.3* 06/23/2014 0147   HCT 37.9* 06/23/2014 0147   PLT 418* 06/23/2014 0147   MCV 97.9 06/23/2014 0147   MCH 31.8 06/23/2014 0147   MCHC 32.5 06/23/2014 0147   RDW 14.1 06/23/2014 0147   LYMPHSABS 1.1 06/23/2014 0147   MONOABS 0.8 06/23/2014 0147   EOSABS 0.1 06/23/2014 0147   BASOSABS 0.0 06/23/2014 0147    BMET    Component Value Date/Time   NA 139 06/23/2014 0147   K 4.0 06/23/2014 0147   CL 101 06/23/2014 0147   CO2 29 06/23/2014 0147   GLUCOSE 98 06/23/2014 0147   BUN 23 06/23/2014 0147   CREATININE 0.85 06/23/2014 0147   CALCIUM 8.9 06/23/2014 0147   GFRNONAA 76* 06/23/2014 0147   GFRAA 88* 06/23/2014 0147    CMP     Component Value Date/Time   NA 139 06/23/2014 0147   K 4.0 06/23/2014 0147   CL 101 06/23/2014 0147   CO2 29 06/23/2014 0147   GLUCOSE 98 06/23/2014 0147    BUN 23 06/23/2014 0147   CREATININE 0.85 06/23/2014 0147   CALCIUM 8.9 06/23/2014 0147   PROT 6.6 06/23/2014 0147   ALBUMIN 2.9* 06/23/2014 0147   AST 25 06/23/2014 0147   ALT 21 06/23/2014 0147   ALKPHOS 100 06/23/2014 0147   BILITOT 0.3 06/23/2014 0147   GFRNONAA 76* 06/23/2014 0147   GFRAA 88* 06/23/2014 0147   11/27 CXR IMPRESSION: 1. No acute findings. 2. Chronic, marked elevation of the left diaphragm. Elevation is exacerbated by a large retroperitoneal liposarcoma.  11/27 CT Head No acute intracranial process.  Involutional changes. Mild white matter changes, less than expected for age.  Bilateral basal ganglia perivascular spaces and/or lacunar infarcts.  LEFT paranasal sinusitis, considering LEFT maxillary periapical abscess, this may be in part odontogenic in origin.  Total Time: 50 minutes  Greater than 50%  of this time was spent counseling and coordinating care related to the above assessment and plan.   Doran Clay D.O. Palliative Medicine Team at [facility]  Pager: [phone] Team Phone: [phone]

## 2014-06-23 NOTE — Progress Notes (Signed)
80 year old male with h/o hypertension and hypothyroidism, comesin for AMS. He is being evaluated for new onset dementia, hypothyroidism.  Pt seen and examined. Palliative care consulted and recommendations given.  Patient currently denies any complaints. He is oriented to place and person.  Continue to monitor.    Hosie Poisson, MD [phone]

## 2014-06-23 NOTE — H&P (Signed)
[facility] History and Physical  Lemmie Evens BJY:[id number] DOB: [birth year] DOA: 06/23/2014  Referring physician: ER physician. PCP: No primary care provider on file.   Chief Complaint: Altered mental status.  History obtained from patient's son and daughter-in-law. Patient is confused.  HPI: Ricky Todd is a 80 y.o. male with a history of hypertension hypothyroidism and previous history of colon cancer 23 years ago and had colectomy and recently diagnosed retroperitoneal liposarcoma which as per the family patient does not know of the diagnosis was brought to the ER from the assisted living facility after patient was found to be increasingly confused. As per patient's family patient was in assisted living facility since August of this year and over the last couple of months patient being increasingly forgetful and confused. Patient recently had a nausea vomiting and had a CAT scan which showed retroperitoneal liposarcoma and patient's family and primary care has not perceived further management on this. Patient is not aware of the diagnosis though. [name] family note is that patient became increasing confused particularly yesterday where patient was trying to eat when there was no food in front of him and was appearing very confused. In the ER patient was found to be confused and oriented to his name and nonfocal on exam otherwise. CT head did not show anything acute. Patient has been admitted for further management. Patient on exam he is afebrile and follows commands and moves all extremities and is oriented to his name and is able to recall his primary care's knee and sons name. He is not able to say why he is not here. Denies any abdominal pain chest pain or shortness of breath. [name] family states that patient is supposed to move to a nursing home [facility] on Monday 06/26/2014. Some of the paperwork was still pending as per the family. As per family patient was not started on any  new medications recently.  Review of Systems: As presented in the history of presenting illness, rest negative.  Past Medical History  Diagnosis Date  . Hypertension   . Anxiety   . Carcinoma of large intestine   . Diverticulosis   . Edema of foot   . Thyroid disease   . Insomnia    Past Surgical History  Procedure Laterality Date  . Colon surgery     Social History:  reports that he has quit smoking. He does not have any smokeless tobacco history on file. He reports that he does not drink alcohol or use illicit drugs. Where does patient live in assisted living facility. Can patient participate in ADLs? Not sure.  Allergies  Allergen Reactions  . Demerol [Meperidine] Other (See Comments)    unknown    Family History:  Family History  Problem Relation Age of Onset  . Leukemia Mother   . Dementia Neg Hx       Prior to Admission medications   Medication Sig Start Date End Date Taking? Authorizing Provider  acetaminophen (TYLENOL) 500 MG tablet Take 1,000 mg by mouth 2 (two) times daily.   Yes Historical Provider, MD  amLODipine (NORVASC) 5 MG tablet Take 5 mg by mouth daily.   Yes Historical Provider, MD  aspirin EC 81 MG tablet Take 81 mg by mouth daily.   Yes Historical Provider, MD  busPIRone (BUSPAR) 10 MG tablet Take 20 mg by mouth every 8 (eight) hours.   Yes Historical Provider, MD  ENSURE PLUS (ENSURE PLUS) LIQD Take 237 mLs by mouth 2 (two) times daily between  meals.   Yes Historical Provider, MD  escitalopram (LEXAPRO) 20 MG tablet Take 20 mg by mouth daily.   Yes Historical Provider, MD  levothyroxine (SYNTHROID, LEVOTHROID) 25 MCG tablet Take 25 mcg by mouth daily before breakfast.   Yes Historical Provider, MD  senna (SENOKOT) 8.6 MG tablet Take 1 tablet by mouth daily as needed for constipation.   Yes Historical Provider, MD  tamsulosin (FLOMAX) 0.4 MG CAPS capsule Take 0.4 mg by mouth daily.   Yes Historical Provider, MD  traMADol (ULTRAM) 50 MG tablet Take  50 mg by mouth 4 (four) times daily as needed for moderate pain.   Yes Historical Provider, MD    Physical Exam: Filed Vitals:   06/23/14 0200 06/23/14 0300 06/23/14 0330 06/23/14 0601  BP: 156/94 127/80 138/92 141/85  Pulse: 91 85 91 85  Temp:      TempSrc:    Oral  Resp: 13 19 15 18   Height:    5\' 5"  (1.651 m)  Weight:    54.432 kg (120 lb)  SpO2: 95% 96% 93% 96%     General:  Moderately built and nourished.  Eyes: Anicteric no pallor.  ENT: No discharge from the ears eyes nose mouth.  Neck: No mass felt. No neck rigidity.  Cardiovascular: S1-S2 heard.  Respiratory: No rhonchi or crepitations.  Abdomen: Soft nontender bowel sounds present.  Skin: No rash.  Musculoskeletal: No edema.  Psychiatric: Patient is confused.  Neurologic: Patient is alert awake oriented to his name but is confused otherwise. Moves all extremities. Perla positive. No facial asymmetry. Tongue is midline.  Labs on Admission:  Basic Metabolic Panel:  Recent Labs Lab 06/23/14 0147  NA 139  K 4.0  CL 101  CO2 29  GLUCOSE 98  BUN 23  CREATININE 0.85  CALCIUM 8.9   Liver Function Tests:  Recent Labs Lab 06/23/14 0147  AST 25  ALT 21  ALKPHOS 100  BILITOT 0.3  PROT 6.6  ALBUMIN 2.9*   No results for input(s): LIPASE, AMYLASE in the last 168 hours. No results for input(s): AMMONIA in the last 168 hours. CBC:  Recent Labs Lab 06/23/14 0147  WBC 7.8  NEUTROABS 5.8  HGB 12.3*  HCT 37.9*  MCV 97.9  PLT 418*   Cardiac Enzymes: No results for input(s): CKTOTAL, CKMB, CKMBINDEX, TROPONINI in the last 168 hours.  BNP (last 3 results) No results for input(s): PROBNP in the last 8760 hours. CBG: No results for input(s): GLUCAP in the last 168 hours.  Radiological Exams on Admission: Dg Chest 2 View  06/23/2014   CLINICAL DATA:  Altered mental status and shortness of breath.  EXAM: CHEST  2 VIEW  COMPARISON:  12/11/2008  FINDINGS: Although there is cardiac prominence, no  cardiomegaly when correlating with previous CT imaging, rather the heart is displaced to the right. The aorta is tortuous. There is mass effect on the trachea, displaced to the left. This is a chronic finding, stable from 2010. And is likely from ectatic vasculature, less likely from thyroid nodule.  There is chronic, marked elevation of the left diaphragm, exacerbated by a large liposarcoma noted on previous abdominal CT. No evidence for edema, effusion, pneumonia, or pneumothorax.  IMPRESSION: 1. No acute findings. 2. Chronic, marked elevation of the left diaphragm. Elevation is exacerbated by a large retroperitoneal liposarcoma.   Electronically Signed   By: Jorje Guild M.D.   On: 06/23/2014 01:23   Ct Head Wo Contrast  06/23/2014   CLINICAL DATA:  Increasing confusion, history of hypertension. Colon cancer.  EXAM: CT HEAD WITHOUT CONTRAST  TECHNIQUE: Contiguous axial images were obtained from the base of the skull through the vertex without intravenous contrast.  COMPARISON:  None.  FINDINGS: The ventricles and sulci are normal for age. No intraparenchymal hemorrhage, mass effect nor midline shift. Patchy supratentorial white matter hypodensities are less than expected for patient's age and though non-specific suggest sequelae of chronic small vessel ischemic disease. No acute large vascular territory infarcts. Patchy hypodensities in the bilateral basal ganglia likely reflect perivascular spaces and or lacunar infarcts.  No abnormal extra-axial fluid collections. Basal cisterns are patent. Moderate calcific atherosclerosis of the carotid siphons.  No skull fracture. The included ocular globes and orbital contents are non-suspicious. Status post bilateral ocular lens implants. Soft tissue completely opacifies the LEFT maxilla with chronic bony wall thickening. LEFT frontal ethmoidal sinusitis. LEFT maxillary periapical abscess.  IMPRESSION: No acute intracranial process.  Involutional changes. Mild white  matter changes, less than expected for age. Bilateral basal ganglia perivascular spaces and/or lacunar infarcts.  LEFT paranasal sinusitis, considering LEFT maxillary periapical abscess, this may be in part odontogenic in origin.   Electronically Signed   By: Elon Alas   On: 06/23/2014 01:37     Assessment/Plan Principal Problem:   Acute encephalopathy Active Problems:   Essential hypertension, benign   Hypothyroidism   1. Acute encephalopathy/delirium - could be multifactorial primarily could be from possible new onset dementia versus depression. [name] wife was recently placed in a nursing home for which patient has been depressed. Patient also has been eating poorly with failure to thrive. Other differentials include stroke. Patient is afebrile otherwise. Chest x-ray and urine analysis are unremarkable. After discussing the patient's son and daughter-in-law they are not keen in pursuing aggressive course and want to keep patient as comfortable as possible and to minimize labs including further radiological tests. At this time are just ordered ammonia and TSH levels and we will gently hydrate patient and get palliative care consult. If patient tends to be stable then may discharge patient to his nursing home facility [facility] once paperwork is complete. 2. Recently diagnosed liposarcoma of the retroperitoneum - patient's family is not keen on pursuing aggressive workup and patient is not aware of the diagnosis.  3. Hypertension - continue home medications. 4. Hypothyroidism - continue Synthroid. Check TSH. 5. History of colon cancer status post partial colectomy 23 years ago. 6. History of anxiety and depression - continue present medication.    Code Status: DO NOT RESUSCITATE.  Family Communication: [name] son and daughter-in-law.  Disposition Plan: Admit to inpatient.    Amanpreet Delmont N. [facility] Pager [phone].  If 7PM-7AM, please contact  night-coverage [email or web address] Password [facility] 06/23/2014, 6:22 AM

## 2014-06-23 NOTE — Progress Notes (Signed)
Clinical Social Work Department BRIEF PSYCHOSOCIAL ASSESSMENT 06/23/2014  Patient:  Ricky Todd, Ricky Todd     Account Number:  [id number]     Admit date:  06/23/2014  Clinical Social Worker:  Lacie Scotts  Date/Time:  06/23/2014 04:48 PM  Referred by:  CSW  Date Referred:  06/23/2014 Referred for  SNF Placement   Other Referral:   Interview type:  Family Other interview type:    PSYCHOSOCIAL DATA Living Status:  WIFE Admitted from facility:   Level of care:   Primary support name:  Ricky Todd Primary support relationship to patient:  CHILD, ADULT Degree of support available:   supportive    CURRENT CONCERNS Current Concerns  Post-Acute Placement   Other Concerns:    SOCIAL WORK ASSESSMENT / PLAN Pt is an 80 yr old gentleman admitted from [place]. CSW met with pt / daughter in law to assist with d/c planning. SNF placement is needed at this time. Prior arrangements have been made with [facility] for placement. SNF has been contacted and d/c plans have been confirmed. SNF bed is available on 11/28.   Assessment/plan status:  Psychosocial Support/Ongoing Assessment of Needs Other assessment/ plan:   Information/referral to community resources:   Family is aware that this is a pvt pay placement. Insurance coverage for ambulance transport reviewed.    [name]/FAMILY'S RESPONSE TO PLAN OF CARE: Pt / family are in agreement with d/c to [facility] 11/28. Pt's spouse will join pt at [facility].   Ricky Lean LCSW [phone]

## 2014-06-23 NOTE — ED Notes (Signed)
Per family, pt suffered a compression fx in August while trying to help his wife up from a fall. In September, pt was also dx with AAA and intestinal CA. Per family, pt is unaware of the CA dx. Pt has had failure to thrive and periodic confusion since moving into an independent living facility in September . Today family was visiting the pt and found that he was acting more confused than normal. Pt's wife had a stroke this morning. Pt is unaware of his wife's status.

## 2014-06-23 NOTE — Progress Notes (Addendum)
Clinical Social Work Department CLINICAL SOCIAL WORK PLACEMENT NOTE 06/23/2014  Patient:  CEDARIUS, KERSH  Account Number:  [id number] Admit date:  06/23/2014  Clinical Social Worker:  [name], LCSW  Date/time:  06/23/2014 05:00 PM  Clinical Social Work is seeking post-discharge placement for this patient at the following level of care:   SKILLED NURSING   (*CSW will update this form in Epic as items are completed)     Patient/family provided with [facility] Department of Clinical Social Work's list of facilities offering this level of care within the geographic area requested by the patient (or if unable, by the patient's family).  06/23/2014  Patient/family informed of their freedom to choose among providers that offer the needed level of care, that participate in Medicare, Medicaid or managed care program needed by the patient, have an available bed and are willing to accept the patient.    Patient/family informed of [facility]' ownership interest in [facility], as well as of the fact that they are under no obligation to receive care at this facility.  PASARR submitted to EDS on 06/23/2014 PASARR number received on 06/23/2014  FL2 transmitted to all facilities in geographic area requested by pt/family on  06/23/2014 FL2 transmitted to all facilities within larger geographic area on   Patient informed that his/her managed care company has contracts with or will negotiate with  certain facilities, including the following:     Patient/family informed of bed offers received:  06/23/2014 Patient chooses bed at [place] Physician recommends and patient chooses bed at    Patient to be transferred to  on  [place] on 06/26/2014 Patient to be transferred to facility by ambulance [facility]) Patient and family notified of transfer on 06/26/2014 Name of family member notified:  Pt son, Elta Guadeloupe notified via telephone  The following  physician request were entered in Epic:   Additional Comments: [name] LCSW [phone]   Alison Murray, MSW, [place] Work [phone]

## 2014-06-24 LAB — BASIC METABOLIC PANEL
ANION GAP: 7 (ref 5–15)
BUN: 17 mg/dL (ref 6–23)
CALCIUM: 8.6 mg/dL (ref 8.4–10.5)
CO2: 28 mEq/L (ref 19–32)
Chloride: 104 mEq/L (ref 96–112)
Creatinine, Ser: 0.79 mg/dL (ref 0.50–1.35)
GFR, EST NON AFRICAN AMERICAN: 78 mL/min — AB (ref >90)
Glucose, Bld: 118 mg/dL — ABNORMAL HIGH (ref 70–99)
Potassium: 3.7 mEq/L (ref 3.7–5.3)
SODIUM: 139 meq/L (ref 137–147)

## 2014-06-24 LAB — CBC
HCT: 35.4 % — ABNORMAL LOW (ref 39.0–52.0)
Hemoglobin: 11.5 g/dL — ABNORMAL LOW (ref 13.0–17.0)
MCH: 31.9 pg (ref 26.0–34.0)
MCHC: 32.5 g/dL (ref 30.0–36.0)
MCV: 98.3 fL (ref 78.0–100.0)
Platelets: 404 10*3/uL — ABNORMAL HIGH (ref 150–400)
RBC: 3.6 MIL/uL — ABNORMAL LOW (ref 4.22–5.81)
RDW: 14 % (ref 11.5–15.5)
WBC: 7.5 10*3/uL (ref 4.0–10.5)

## 2014-06-24 MED ORDER — HALOPERIDOL LACTATE 5 MG/ML IJ SOLN
INTRAMUSCULAR | Status: AC
  Administered 2014-06-24: 5 mg
  Filled 2014-06-24: qty 1

## 2014-06-24 MED ORDER — HALOPERIDOL LACTATE 5 MG/ML IJ SOLN
5.0000 mg | Freq: Four times a day (QID) | INTRAMUSCULAR | Status: DC | PRN
  Administered 2014-06-24 – 2014-06-25 (×2): 5 mg via INTRAVENOUS
  Filled 2014-06-24 (×2): qty 1

## 2014-06-24 MED ORDER — LORAZEPAM 2 MG/ML IJ SOLN
1.0000 mg | Freq: Once | INTRAMUSCULAR | Status: AC
  Administered 2014-06-24: 2 mg via INTRAVENOUS
  Filled 2014-06-24: qty 1

## 2014-06-24 MED ORDER — INFLUENZA VAC SPLIT QUAD 0.5 ML IM SUSY
0.5000 mL | PREFILLED_SYRINGE | INTRAMUSCULAR | Status: AC
Start: 2014-06-25 — End: 2014-06-25
  Administered 2014-06-25: 0.5 mL via INTRAMUSCULAR
  Filled 2014-06-24 (×2): qty 0.5

## 2014-06-24 MED ORDER — LORAZEPAM 2 MG/ML IJ SOLN
0.5000 mg | Freq: Once | INTRAMUSCULAR | Status: AC
  Administered 2014-06-24: 0.5 mg via INTRAVENOUS
  Filled 2014-06-24: qty 1

## 2014-06-24 MED ORDER — AMOXICILLIN-POT CLAVULANATE 875-125 MG PO TABS
1.0000 | ORAL_TABLET | Freq: Two times a day (BID) | ORAL | Status: DC
  Administered 2014-06-24 – 2014-06-26 (×4): 1 via ORAL
  Filled 2014-06-24 (×6): qty 1

## 2014-06-24 NOTE — Progress Notes (Addendum)
   06/24/14 1200  What Happened  Was fall witnessed? No  Was patient injured? No  Patient found on floor  Found by Staff-comment  Stated prior activity other (comment) (asleep)  Follow Up  MD notified Dr Karleen Hampshire  Time MD notified 1200  Family notified Yes-comment  Time family notified 1210  Additional tests No  Simple treatment Other (comment) (given haldol and ativan)  Progress note created (see row info) Yes  Adult Fall Risk Assessment  Risk Factor Category (scoring not indicated) Fall has occurred during this admission (document High fall risk)  [name] Fall Risk High Fall Risk (>13 points)  Adult Fall Risk Interventions  Required Bundle Interventions *See Row Information* High fall risk - low, moderate, and high requirements implemented  Additional Interventions Secure all tubes/drains;Room near nurses station;Fall risk signage  Vitals  Temp 98 F (36.7 C)  Temp Source Oral  BP 127/65 mmHg  BP Location Right Arm  BP Method Automatic  Patient Position (if appropriate) Lying  Pulse Rate (!) 103  Pulse Rate Source Dinamap  Resp 16  Oxygen Therapy  SpO2 97 %  O2 Device Nasal Cannula  O2 Flow Rate (L/min) 2 L/min  FiO2 (%) 28 %  Pain Assessment  Pain Assessment No/denies pain  Pain Score 0  Neurological  Neuro (WDL) X  Level of Consciousness Alert  Orientation Level Disoriented to person;Disoriented to place;Disoriented to time;Oriented to situation  Cognition Poor attention/concentration;Poor judgement;Poor safety awareness  Speech Other (Comment) (mumbles)  Pupil Assessment  No  Motor Function/Sensation Assessment Grip  R Hand Grip Strong  L Hand Grip Strong  Neuro Symptoms Agitation  Musculoskeletal  Musculoskeletal (WDL) X  Assistive Device None  Weight Bearing Restrictions No  Integumentary  Integumentary (WDL) X  Skin Color Appropriate for ethnicity  Skin Condition Dry  Skin Integrity Intact  Skin Turgor Tenting  Staff responded to bed alarm and  found patient on knees on the floor. Unable to orient patient or have patient follow directions, staff assisted patient back into bed. No evidence of injury, ROM WNL, no skin injury noted. Neuro status at pre-fall baseline.  Prior to fall patient resting comfortably following PRN dose of Ativan during night for agitation.  Patient disoriented to name, location, time, and situation prior and post fall.  Patient agitated and continues to try to get out of bed insisting he has to go.  MD and patient's son Ricky Todd notified of fall.   Order for PRN haldol obtained and dose administered. Following haldol dose patient relaxed and sedated. Will continue to monitor.

## 2014-06-24 NOTE — Progress Notes (Signed)
[facility] PROGRESS NOTE  Ricky Todd YNW:[id number] DOB: 11-28-1923 DOA: 06/23/2014 PCP: No primary care provider on file.  Assessment/Plan: 1. Acute encephalopathy: - probably worsening of his dementia vs hospital stay delirium. - haldol ordered and prn ativan.  -  CXR and UA is negative for infection.  -EKG does not show any qt prolongation.  CT head does not reveal acute pathology . If his mental status does not improve by tomorrow, we will follow up with an MRI of the Brain. He was alert and oriented to place and person yesterday morning.   Liposarcoma of the retroperitoneum :  No aggressive work up as per family.   Hypertension: Controlled.   Hypothyroidism: TSH within normal limits.    Anxiety and depression: Resume home meds.     Code Status: DNR Family Communication: will call son later today Disposition Plan: pending. PT eval.    Consultants:  Palliative care consult  Procedures:  none  Antibiotics:  none  HPI/Subjective: Pt was slightly agitated last night and was given a dose of ativan at night. He has been sleeping since then. He is currently on 2 liters of [place] oxygen.   Objective: Filed Vitals:   06/24/14 0615  BP: 132/90  Pulse: 75  Temp: 97.7 F (36.5 C)  Resp: 16    Intake/Output Summary (Last 24 hours) at 06/24/14 1200 Last data filed at 06/24/14 0254  Gross per 24 hour  Intake    600 ml  Output   1075 ml  Net   -475 ml   Filed Weights   06/23/14 0601  Weight: 54.432 kg (120 lb)    Exam:   General:  Alert afebrile sleeping comfortably  Cardiovascular: s1s2  Respiratory: clear to ausculatation  Abdomen: soft non tender non distended bowel sounds heard  Musculoskeletal: no pedal edema  Data Reviewed: Basic Metabolic Panel:  Recent Labs Lab 06/23/14 0147 06/24/14 0455  NA 139 139  K 4.0 3.7  CL 101 104  CO2 29 28  GLUCOSE 98 118*  BUN 23 17  CREATININE 0.85 0.79  CALCIUM 8.9 8.6   Liver Function  Tests:  Recent Labs Lab 06/23/14 0147  AST 25  ALT 21  ALKPHOS 100  BILITOT 0.3  PROT 6.6  ALBUMIN 2.9*   No results for input(s): LIPASE, AMYLASE in the last 168 hours.  Recent Labs Lab 06/23/14 0800  AMMONIA 21   CBC:  Recent Labs Lab 06/23/14 0147 06/24/14 0455  WBC 7.8 7.5  NEUTROABS 5.8  --   HGB 12.3* 11.5*  HCT 37.9* 35.4*  MCV 97.9 98.3  PLT 418* 404*   Cardiac Enzymes: No results for input(s): CKTOTAL, CKMB, CKMBINDEX, TROPONINI in the last 168 hours. BNP (last 3 results) No results for input(s): PROBNP in the last 8760 hours. CBG: No results for input(s): GLUCAP in the last 168 hours.  No results found for this or any previous visit (from the past 240 hour(s)).   Studies: Dg Chest 2 View  06/23/2014   CLINICAL DATA:  Altered mental status and shortness of breath.  EXAM: CHEST  2 VIEW  COMPARISON:  12/11/2008  FINDINGS: Although there is cardiac prominence, no cardiomegaly when correlating with previous CT imaging, rather the heart is displaced to the right. The aorta is tortuous. There is mass effect on the trachea, displaced to the left. This is a chronic finding, stable from 2010. And is likely from ectatic vasculature, less likely from thyroid nodule.  There is chronic, marked elevation of  the left diaphragm, exacerbated by a large liposarcoma noted on previous abdominal CT. No evidence for edema, effusion, pneumonia, or pneumothorax.  IMPRESSION: 1. No acute findings. 2. Chronic, marked elevation of the left diaphragm. Elevation is exacerbated by a large retroperitoneal liposarcoma.   Electronically Signed   By: Jorje Guild M.D.   On: 06/23/2014 01:23   Ct Head Wo Contrast  06/23/2014   CLINICAL DATA:  Increasing confusion, history of hypertension. Colon cancer.  EXAM: CT HEAD WITHOUT CONTRAST  TECHNIQUE: Contiguous axial images were obtained from the base of the skull through the vertex without intravenous contrast.  COMPARISON:  None.  FINDINGS: The  ventricles and sulci are normal for age. No intraparenchymal hemorrhage, mass effect nor midline shift. Patchy supratentorial white matter hypodensities are less than expected for patient's age and though non-specific suggest sequelae of chronic small vessel ischemic disease. No acute large vascular territory infarcts. Patchy hypodensities in the bilateral basal ganglia likely reflect perivascular spaces and or lacunar infarcts.  No abnormal extra-axial fluid collections. Basal cisterns are patent. Moderate calcific atherosclerosis of the carotid siphons.  No skull fracture. The included ocular globes and orbital contents are non-suspicious. Status post bilateral ocular lens implants. Soft tissue completely opacifies the LEFT maxilla with chronic bony wall thickening. LEFT frontal ethmoidal sinusitis. LEFT maxillary periapical abscess.  IMPRESSION: No acute intracranial process.  Involutional changes. Mild white matter changes, less than expected for age. Bilateral basal ganglia perivascular spaces and/or lacunar infarcts.  LEFT paranasal sinusitis, considering LEFT maxillary periapical abscess, this may be in part odontogenic in origin.   Electronically Signed   By: Elon Alas   On: 06/23/2014 01:37    Scheduled Meds: . amLODipine  5 mg Oral Daily  . aspirin EC  81 mg Oral Daily  . busPIRone  20 mg Oral 3 times per day  . enoxaparin (LOVENOX) injection  40 mg Subcutaneous Q24H  . escitalopram  20 mg Oral Daily  . feeding supplement (ENSURE COMPLETE)  237 mL Oral BID BM  . [START ON 06/25/2014] Influenza vac split quadrivalent PF  0.5 mL Intramuscular Tomorrow-1000  . levothyroxine  25 mcg Oral QAC breakfast  . mirtazapine  7.5 mg Oral QHS  . tamsulosin  0.4 mg Oral Daily   Continuous Infusions:   Principal Problem:   Acute encephalopathy Active Problems:   Essential hypertension, benign   Hypothyroidism   Palliative care encounter   Depression   Loss of appetite    Time spent: 15  min    [facility] Pager [phone] If 7PM-7AM, please contact night-coverage at [email or web address], password [facility] 06/24/2014, 12:00 PM  LOS: 1 day

## 2014-06-24 NOTE — Progress Notes (Signed)
Pt got very anxious and agitated  and confused. All other non medicinal interventions put in  Place  And MD  Notified.

## 2014-06-25 ENCOUNTER — Encounter (HOSPITAL_COMMUNITY): Payer: Self-pay | Admitting: Radiology

## 2014-06-25 ENCOUNTER — Inpatient Hospital Stay (HOSPITAL_COMMUNITY): Payer: Medicare Other

## 2014-06-25 DIAGNOSIS — G9340 Encephalopathy, unspecified: Secondary | ICD-10-CM | POA: Diagnosis not present

## 2014-06-25 MED ORDER — LORAZEPAM 2 MG/ML IJ SOLN
1.0000 mg | Freq: Once | INTRAMUSCULAR | Status: AC
  Administered 2014-06-25: 2 mg via INTRAVENOUS

## 2014-06-25 MED ORDER — LORAZEPAM 2 MG/ML IJ SOLN
INTRAMUSCULAR | Status: AC
  Filled 2014-06-25: qty 1

## 2014-06-25 NOTE — Progress Notes (Signed)
[facility] PROGRESS NOTE  Ricky Todd QXI:[id number] DOB: [birth year] DOA: 06/23/2014 PCP: No primary care provider on file.  Assessment/Plan: 1. Acute encephalopathy: - probably worsening of his dementia vs hospital stay delirium. - haldol ordered and prn ativan.  -  CXR and UA is negative for infection.  -EKG does not show any qt prolongation.  CT head does not reveal acute pathology .MRI brain does not show acute stroke. He was alert and oriented to place and person yesterday morning.   Liposarcoma of the retroperitoneum :  No aggressive work up as per family.   Hypertension: Controlled.   Hypothyroidism: TSH within normal limits.    Anxiety and depression: Resume home meds.     Code Status: DNR Family Communication: none at bedside. Disposition Plan:  PT eval. For SNF tomorrow.    Consultants:  Palliative care consult  Procedures:  none  Antibiotics:  none  HPI/Subjective: Comfortably sleeping.   Objective: Filed Vitals:   06/25/14 1700  BP: 128/69  Pulse: 91  Temp: 97.9 F (36.6 C)  Resp: 16    Intake/Output Summary (Last 24 hours) at 06/25/14 2034 Last data filed at 06/25/14 1700  Gross per 24 hour  Intake    360 ml  Output   1500 ml  Net  -1140 ml   Filed Weights   06/23/14 0601  Weight: 54.432 kg (120 lb)    Exam:   General:  Alert afebrile sleeping comfortably  Cardiovascular: s1s2  Respiratory: clear to ausculatation  Abdomen: soft non tender non distended bowel sounds heard  Musculoskeletal: no pedal edema  Data Reviewed: Basic Metabolic Panel:  Recent Labs Lab 06/23/14 0147 06/24/14 0455  NA 139 139  K 4.0 3.7  CL 101 104  CO2 29 28  GLUCOSE 98 118*  BUN 23 17  CREATININE 0.85 0.79  CALCIUM 8.9 8.6   Liver Function Tests:  Recent Labs Lab 06/23/14 0147  AST 25  ALT 21  ALKPHOS 100  BILITOT 0.3  PROT 6.6  ALBUMIN 2.9*   No results for input(s): LIPASE, AMYLASE in the last 168  hours.  Recent Labs Lab 06/23/14 0800  AMMONIA 21   CBC:  Recent Labs Lab 06/23/14 0147 06/24/14 0455  WBC 7.8 7.5  NEUTROABS 5.8  --   HGB 12.3* 11.5*  HCT 37.9* 35.4*  MCV 97.9 98.3  PLT 418* 404*   Cardiac Enzymes: No results for input(s): CKTOTAL, CKMB, CKMBINDEX, TROPONINI in the last 168 hours. BNP (last 3 results) No results for input(s): PROBNP in the last 8760 hours. CBG: No results for input(s): GLUCAP in the last 168 hours.  No results found for this or any previous visit (from the past 240 hour(s)).   Studies: Mr Brain Wo Contrast  06/25/2014   CLINICAL DATA:  Encephalopathy  EXAM: MRI HEAD WITHOUT CONTRAST  TECHNIQUE: Multiplanar, multiecho pulse sequences of the brain and surrounding structures were obtained without intravenous contrast.  COMPARISON:  CT head 06/23/2014  FINDINGS: The patient was medicated for the study however remained confused and was not able to hold still. The patient was not able a complete the study which was terminated before completing all sequences.  Generalized atrophy. Mild chronic microvascular ischemic change in the white matter  Negative for acute infarct  No mass lesion identified  Complete opacification left maxillary ethmoid and frontal sinuses as noted on the prior CT.  IMPRESSION: Incomplete study.  Negative for acute infarct.   Electronically Signed   By: Franchot Gallo M.D.  On: 06/25/2014 14:20    Scheduled Meds: . amLODipine  5 mg Oral Daily  . amoxicillin-clavulanate  1 tablet Oral Q12H  . aspirin EC  81 mg Oral Daily  . busPIRone  20 mg Oral 3 times per day  . enoxaparin (LOVENOX) injection  40 mg Subcutaneous Q24H  . escitalopram  20 mg Oral Daily  . feeding supplement (ENSURE COMPLETE)  237 mL Oral BID BM  . levothyroxine  25 mcg Oral QAC breakfast  . LORazepam      . mirtazapine  7.5 mg Oral QHS  . tamsulosin  0.4 mg Oral Daily   Continuous Infusions:   Principal Problem:   Acute encephalopathy Active  Problems:   Essential hypertension, benign   Hypothyroidism   Palliative care encounter   Depression   Loss of appetite    Time spent: 15 min    [place] [facility] Pager [phone] If 7PM-7AM, please contact night-coverage at [email or web address], password [facility] 06/25/2014, 8:34 PM  LOS: 2 days

## 2014-06-25 NOTE — Progress Notes (Signed)
MRI partially complete, pt became very agitated during the procedure, PRN Haldol administered with no response, MD notified and one time dose of ativan administered with minimal response.  MRI procedure stopped and patient transported back to room. Will continue to monitor.

## 2014-06-25 NOTE — Evaluation (Signed)
Physical Therapy Evaluation Patient Details Name: Ricky Todd MRN: [id number] DOB: Mar 27, 1924 Today's Date: 06/25/2014   History of Present Illness  Ricky Todd is a 80 y.o. male with a history of hypertension hypothyroidism and previous history of colon cancer 23 years ago and had colectomy and recently diagnosed retroperitoneal liposarcoma which as per the family patient does not know of the diagnosis was brought to the ER from the assisted living facility after patient was found to be increasingly confused.  Clinical Impression  *Pt admitted with acute encephalopathy*. Pt currently with functional limitations due to the deficits listed below (see PT Problem List).  Pt will benefit from skilled PT to increase their independence and safety with mobility to allow discharge to the venue listed below.   Pt ambulated 40' with RW and min/guard assist. He is lethargic, likely due to medication.  SNF recommended.   **    Follow Up Recommendations SNF    Equipment Recommendations  None recommended by PT    Recommendations for Other Services       Precautions / Restrictions Precautions Precautions: Fall Precaution Comments: family stated pt has not had falls at ILF, per chart pt fell OOB to knees 06/24/14 in hospital Restrictions Weight Bearing Restrictions: No      Mobility  Bed Mobility Overal bed mobility: Needs Assistance Bed Mobility: Supine to Sit     Supine to sit: Mod assist     General bed mobility comments: assist to initiate movement   Transfers Overall transfer level: Needs assistance Equipment used: Rolling walker (2 wheeled) Transfers: Sit to/from Stand Sit to Stand: Min assist;From elevated surface         General transfer comment: assist to rise from elevated bed  Ambulation/Gait Ambulation/Gait assistance: Min guard Ambulation Distance (Feet): 45 Feet Assistive device: Rolling walker (2 wheeled) Gait Pattern/deviations: Decreased stride  length;Decreased step length - right;Decreased step length - left;Trunk flexed;Step-through pattern   Gait velocity interpretation: Below normal speed for age/gender General Gait Details: cues for flexed posture, per son pt has short step length at baseline  Stairs            Wheelchair Mobility    Modified Rankin (Stroke Patients Only)       Balance Overall balance assessment: Needs assistance   Sitting balance-Leahy Scale: Good     Standing balance support: Bilateral upper extremity supported Standing balance-Leahy Scale: Poor                               Pertinent Vitals/Pain Pain Assessment: No/denies pain    Home Living Family/patient expects to be discharged to:: Skilled nursing facility                      Prior Function Level of Independence: Needs assistance   Gait / Transfers Assistance Needed: walked in apt independently with RW  ADL's / Homemaking Assistance Needed: had assist for bathing/dressing at ILF        Hand Dominance        Extremity/Trunk Assessment   Upper Extremity Assessment: Overall WFL for tasks assessed           Lower Extremity Assessment: Overall WFL for tasks assessed      Cervical / Trunk Assessment: Kyphotic  Communication   Communication: No difficulties  Cognition Arousal/Alertness: Lethargic Behavior During Therapy: Flat affect Overall Cognitive Status: Difficult to assess  General Comments      Exercises        Assessment/Plan    PT Assessment Patient needs continued PT services  PT Diagnosis Generalized weakness;Difficulty walking   PT Problem List Decreased activity tolerance;Decreased mobility;Decreased balance;Decreased safety awareness;Decreased cognition  PT Treatment Interventions Gait training;DME instruction;Functional mobility training;Therapeutic activities;Patient/family education;Balance training;Therapeutic exercise   PT Goals  (Current goals can be found in the Care Plan section) Acute Rehab PT Goals Patient Stated Goal: DC to SNF PT Goal Formulation: With family Time For Goal Achievement: 07/09/14 Potential to Achieve Goals: Fair    Frequency Min 3X/week   Barriers to discharge        Co-evaluation               End of Session Equipment Utilized During Treatment: Gait belt Activity Tolerance: Patient tolerated treatment well Patient left: in chair;with call bell/phone within reach;with family/visitor present;with nursing/sitter in room Nurse Communication: Mobility status         Time: 0913-0936 PT Time Calculation (min) (ACUTE ONLY): 23 min   Charges:   PT Evaluation $Initial PT Evaluation Tier I: 1 Procedure PT Treatments $Gait Training: 8-22 mins   PT G Codes:          Philomena Doheny 06/25/2014, 9:45 AM

## 2014-06-26 MED ORDER — TRAMADOL HCL 50 MG PO TABS: 50.0000 mg | ORAL_TABLET | Freq: Four times a day (QID) | ORAL | Status: AC | PRN

## 2014-06-26 MED ORDER — MIRTAZAPINE 7.5 MG PO TABS: 7.5000 mg | ORAL_TABLET | Freq: Two times a day (BID) | ORAL | Status: AC | PRN

## 2014-06-26 MED ORDER — AMOXICILLIN-POT CLAVULANATE 875-125 MG PO TABS: 1.0000 | ORAL_TABLET | Freq: Two times a day (BID) | ORAL | Status: AC

## 2014-06-26 MED ORDER — ENSURE COMPLETE PO LIQD: 237.0000 mL | Freq: Two times a day (BID) | ORAL | Status: AC

## 2014-06-26 MED ORDER — ACETAMINOPHEN 325 MG PO TABS: 650.0000 mg | ORAL_TABLET | Freq: Four times a day (QID) | ORAL | Status: AC | PRN

## 2014-06-26 NOTE — Progress Notes (Signed)
Pt for discharge to [facility] and [employer].  CSW facilitated pt discharge needs including contacting facility, faxing pt discharge information via [facility], discussing with pt son, Elta Guadeloupe via telephone, providing RN phone number to call report, and arranging ambulance transportation via [facility] to [facility] and [facility].   Pt son appreciative of CSW notifying pt son of pt discharge to [facility] and [place].   No further social work needs identified at this time.  CSW signing off.   Alison Murray, MSW, [place] Work [phone]

## 2014-06-26 NOTE — Discharge Summary (Signed)
Physician Discharge Summary  Ricky Todd XTG:[id number] DOB: 1923/12/28 DOA: 06/23/2014  PCP: No primary care provider on file.  Admit date: 06/23/2014 Discharge date: 06/26/2014  Time spent: 30 minutes  Recommendations for Outpatient Follow-up:  Follow up with PCP in 1 to 2 weeks as needed  Discharge Diagnoses:  Principal Problem:   Acute encephalopathy Active Problems:   Essential hypertension, benign   Hypothyroidism   Palliative care encounter   Depression   Loss of appetite   Discharge Condition: improved  Diet recommendation: low salt diet  Filed Weights   06/23/14 0601  Weight: 54.432 kg (120 lb)    History of present illness:  Ricky Todd is a 80 y.o. male with a history of hypertension hypothyroidism and previous history of colon cancer 23 years ago and had colectomy and recently diagnosed retroperitoneal liposarcoma which as per the family patient does not know of the diagnosis was brought to the ER from the assisted living facility after patient was found to be confused. He was admitted for evaluation of acute encephalopathy. On 11/29, pt got out of the bed and had a fall. His mRI following did not show any acute pathology.   Hospital Course:   1. Acute encephalopathy: - probably worsening of his dementia vs hospital stay delirium. - CXR and UA is negative for infection.  -EKG does not show any qt prolongation.  CT head does not reveal acute pathology .MRI brain does not show acute stroke. He was alert and oriented to place and person this am. His TSH is within normal limits. Discussed with son over the phone and plan to discharge him to SNF today. Palliative care consulted and recommendations given.   Liposarcoma of the retroperitoneum :  No aggressive work up as per family.   Hypertension: Controlled.   Hypothyroidism: TSH within normal limits.    Anxiety and depression: Resume home meds.   Procedures:  MRI  brain  Consultations:  Palliative care consult.   Discharge Exam: Filed Vitals:   06/26/14 0545  BP: 124/76  Pulse: 80  Temp: 97.9 F (36.6 C)  Resp: 16    General: alert afebrile and comfortable Cardiovascular: s1s2 Respiratory: ctab  Discharge Instructions You were cared for by a hospitalist during your hospital stay. If you have any questions about your discharge medications or the care you received while you were in the hospital after you are discharged, you can call the unit and asked to speak with the hospitalist on call if the hospitalist that took care of you is not available. Once you are discharged, your primary care physician will handle any further medical issues. Please note that NO REFILLS for any discharge medications will be authorized once you are discharged, as it is imperative that you return to your primary care physician (or establish a relationship with a primary care physician if you do not have one) for your aftercare needs so that they can reassess your need for medications and monitor your lab values.  Discharge Instructions    Diet - low sodium heart healthy    Complete by:  As directed      Discharge instructions    Complete by:  As directed   Follow up with PCP IN ONE WEEK.          Current Discharge Medication List    START taking these medications   Details  amoxicillin-clavulanate (AUGMENTIN) 875-125 MG per tablet Take 1 tablet by mouth every 12 (twelve) hours. Qty: 8 tablet, Refills: 0    !!  feeding supplement, ENSURE COMPLETE, (ENSURE COMPLETE) LIQD Take 237 mLs by mouth 2 (two) times daily between meals.    mirtazapine (REMERON) 7.5 MG tablet Take 1 tablet (7.5 mg total) by mouth 3 times/day as needed-between meals & bedtime. Qty: 10 tablet, Refills: 0     !! - Potential duplicate medications found. Please discuss with provider.    CONTINUE these medications which have CHANGED   Details  acetaminophen (TYLENOL) 325 MG tablet Take 2  tablets (650 mg total) by mouth every 6 (six) hours as needed for mild pain (or Fever >/= 101).      CONTINUE these medications which have NOT CHANGED   Details  amLODipine (NORVASC) 5 MG tablet Take 5 mg by mouth daily.    aspirin EC 81 MG tablet Take 81 mg by mouth daily.    busPIRone (BUSPAR) 10 MG tablet Take 20 mg by mouth every 8 (eight) hours.    !! ENSURE PLUS (ENSURE PLUS) LIQD Take 237 mLs by mouth 2 (two) times daily between meals.    escitalopram (LEXAPRO) 20 MG tablet Take 20 mg by mouth daily.    levothyroxine (SYNTHROID, LEVOTHROID) 25 MCG tablet Take 25 mcg by mouth daily before breakfast.    senna (SENOKOT) 8.6 MG tablet Take 1 tablet by mouth daily as needed for constipation.    tamsulosin (FLOMAX) 0.4 MG CAPS capsule Take 0.4 mg by mouth daily.    traMADol (ULTRAM) 50 MG tablet Take 50 mg by mouth 4 (four) times daily as needed for moderate pain.     !! - Potential duplicate medications found. Please discuss with provider.     Allergies  Allergen Reactions  . Demerol [Meperidine] Other (See Comments)    unknown      The results of significant diagnostics from this hospitalization (including imaging, microbiology, ancillary and laboratory) are listed below for reference.    Significant Diagnostic Studies: Dg Chest 2 View  06/23/2014   CLINICAL DATA:  Altered mental status and shortness of breath.  EXAM: CHEST  2 VIEW  COMPARISON:  12/11/2008  FINDINGS: Although there is cardiac prominence, no cardiomegaly when correlating with previous CT imaging, rather the heart is displaced to the right. The aorta is tortuous. There is mass effect on the trachea, displaced to the left. This is a chronic finding, stable from 2010. And is likely from ectatic vasculature, less likely from thyroid nodule.  There is chronic, marked elevation of the left diaphragm, exacerbated by a large liposarcoma noted on previous abdominal CT. No evidence for edema, effusion, pneumonia, or  pneumothorax.  IMPRESSION: 1. No acute findings. 2. Chronic, marked elevation of the left diaphragm. Elevation is exacerbated by a large retroperitoneal liposarcoma.   Electronically Signed   By: Jorje Guild M.D.   On: 06/23/2014 01:23   Ct Head Wo Contrast  06/23/2014   CLINICAL DATA:  Increasing confusion, history of hypertension. Colon cancer.  EXAM: CT HEAD WITHOUT CONTRAST  TECHNIQUE: Contiguous axial images were obtained from the base of the skull through the vertex without intravenous contrast.  COMPARISON:  None.  FINDINGS: The ventricles and sulci are normal for age. No intraparenchymal hemorrhage, mass effect nor midline shift. Patchy supratentorial white matter hypodensities are less than expected for patient's age and though non-specific suggest sequelae of chronic small vessel ischemic disease. No acute large vascular territory infarcts. Patchy hypodensities in the bilateral basal ganglia likely reflect perivascular spaces and or lacunar infarcts.  No abnormal extra-axial fluid collections. Basal cisterns are patent.  Moderate calcific atherosclerosis of the carotid siphons.  No skull fracture. The included ocular globes and orbital contents are non-suspicious. Status post bilateral ocular lens implants. Soft tissue completely opacifies the LEFT maxilla with chronic bony wall thickening. LEFT frontal ethmoidal sinusitis. LEFT maxillary periapical abscess.  IMPRESSION: No acute intracranial process.  Involutional changes. Mild white matter changes, less than expected for age. Bilateral basal ganglia perivascular spaces and/or lacunar infarcts.  LEFT paranasal sinusitis, considering LEFT maxillary periapical abscess, this may be in part odontogenic in origin.   Electronically Signed   By: Elon Alas   On: 06/23/2014 01:37   Mr Brain Wo Contrast  06/25/2014   CLINICAL DATA:  Encephalopathy  EXAM: MRI HEAD WITHOUT CONTRAST  TECHNIQUE: Multiplanar, multiecho pulse sequences of the brain and  surrounding structures were obtained without intravenous contrast.  COMPARISON:  CT head 06/23/2014  FINDINGS: The patient was medicated for the study however remained confused and was not able to hold still. The patient was not able a complete the study which was terminated before completing all sequences.  Generalized atrophy. Mild chronic microvascular ischemic change in the white matter  Negative for acute infarct  No mass lesion identified  Complete opacification left maxillary ethmoid and frontal sinuses as noted on the prior CT.  IMPRESSION: Incomplete study.  Negative for acute infarct.   Electronically Signed   By: Franchot Gallo M.D.   On: 06/25/2014 14:20    Microbiology: No results found for this or any previous visit (from the past 240 hour(s)).   Labs: Basic Metabolic Panel:  Recent Labs Lab 06/23/14 0147 06/24/14 0455  NA 139 139  K 4.0 3.7  CL 101 104  CO2 29 28  GLUCOSE 98 118*  BUN 23 17  CREATININE 0.85 0.79  CALCIUM 8.9 8.6   Liver Function Tests:  Recent Labs Lab 06/23/14 0147  AST 25  ALT 21  ALKPHOS 100  BILITOT 0.3  PROT 6.6  ALBUMIN 2.9*   No results for input(s): LIPASE, AMYLASE in the last 168 hours.  Recent Labs Lab 06/23/14 0800  AMMONIA 21   CBC:  Recent Labs Lab 06/23/14 0147 06/24/14 0455  WBC 7.8 7.5  NEUTROABS 5.8  --   HGB 12.3* 11.5*  HCT 37.9* 35.4*  MCV 97.9 98.3  PLT 418* 404*   Cardiac Enzymes: No results for input(s): CKTOTAL, CKMB, CKMBINDEX, TROPONINI in the last 168 hours. BNP: BNP (last 3 results) No results for input(s): PROBNP in the last 8760 hours. CBG: No results for input(s): GLUCAP in the last 168 hours.     SignedHosie Poisson  [facility] 06/26/2014, 10:24 AM

## 2014-06-26 NOTE — Care Management Note (Signed)
CARE MANAGEMENT NOTE 06/26/2014  Patient:  Ricky Todd, Ricky Todd   Account Number:  [id number]  Date Initiated:  06/23/2014  Documentation initiated by:  Sunday Spillers  Subjective/Objective Assessment:   80 yo with acute encephalopathy     Action/Plan:   From [facility]   Anticipated DC Date:  06/26/2014   Anticipated DC Plan:  SKILLED NURSING FACILITY  In-house referral  Clinical Social Worker      DC Planning Services  CM consult      Choice offered to / List presented to:             Status of service:  Completed, signed off Medicare Important Message given?  YES (If response is "NO", the following Medicare IM given date fields will be blank) Date Medicare IM given:  06/26/2014 Medicare IM given by:  Marney Doctor Date Additional Medicare IM given:   Additional Medicare IM given by:    Discharge Disposition:  [facility]  Per UR Regulation:  Reviewed for med. necessity/level of care/duration of stay  If discussed at [place] of Stay Meetings, dates discussed:    Comments:  06/26/14 Marney Doctor RN,BSN,NCM

## 2014-06-26 NOTE — Progress Notes (Signed)
Patient to be discharged to SNF, copies of all discharge medications and instructions sent to facility.  Patient to be transported via [facility].

## 2014-06-30 ENCOUNTER — Encounter: Payer: Self-pay | Admitting: *Deleted

## 2015-06-26 IMAGING — CT CT HEAD W/O CM
3 of 4 series · 16 of 30 positions shown, 18 images · non-contrast
Comparison: None.

CLINICAL DATA: Increasing confusion, history of hypertension. Colon
cancer.

EXAM:
CT HEAD WITHOUT CONTRAST
TECHNIQUE: Contiguous axial images were obtained from the base of the skull
through the vertex without intravenous contrast.

[Series 2: head w/o · axial · non-contrast · 0.45mm/px · z∈[+1508,+1628]mm · 6 of 34 slices shown, 8 images]
[im 5/34  brain]
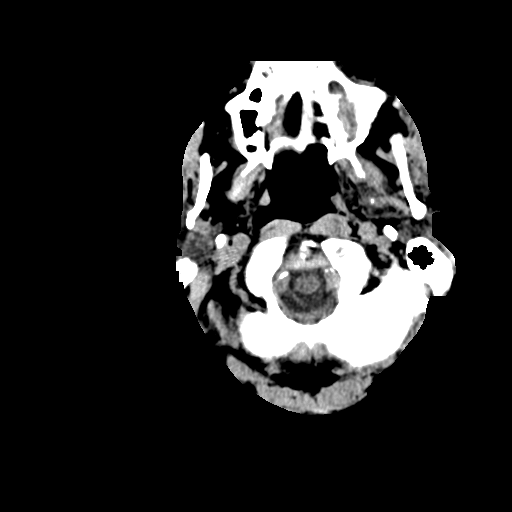
[im 5/34  bone]
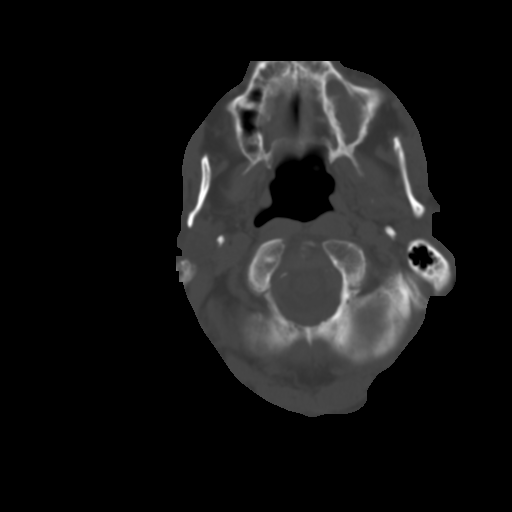
[im 10/34  brain]
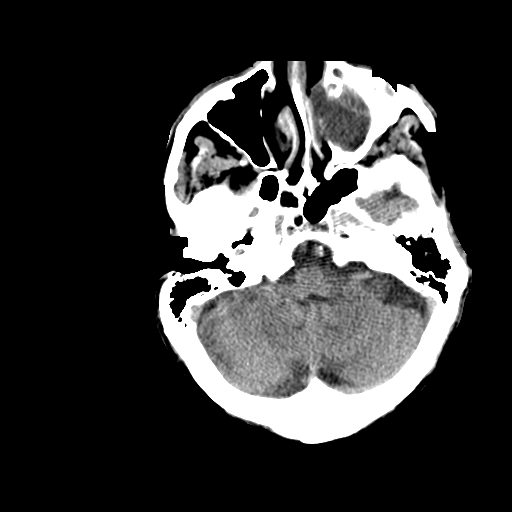
[im 15/34  brain]
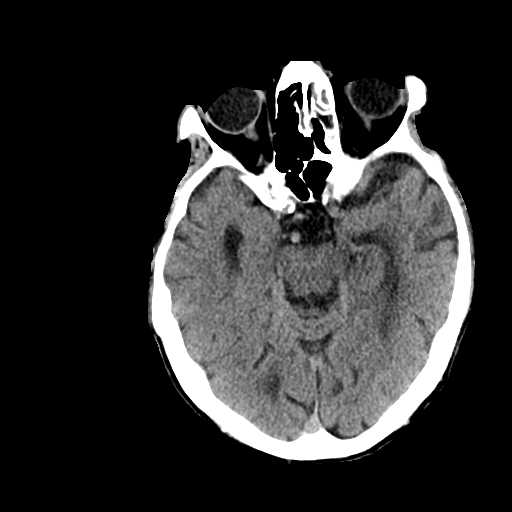
[im 19/34  brain]
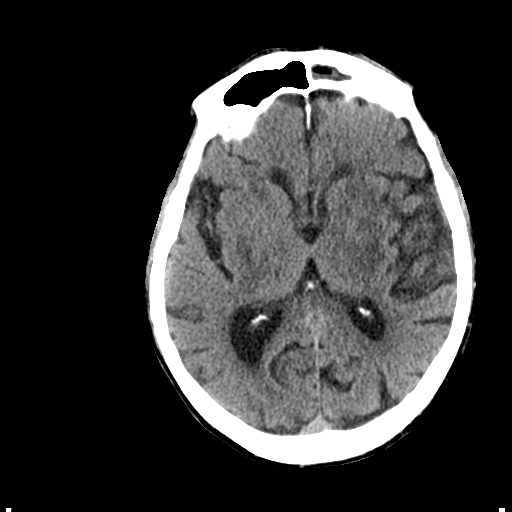
[im 24/34  brain]
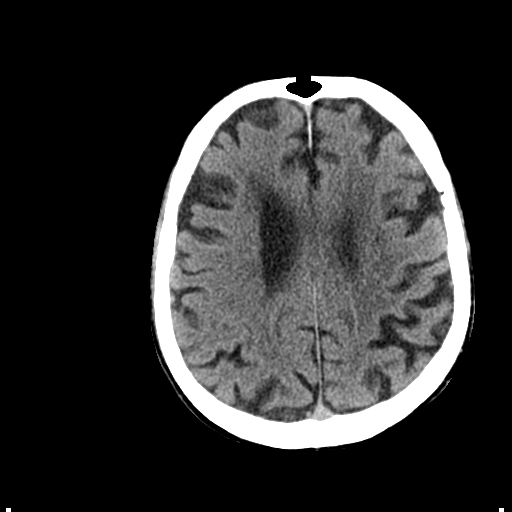
[im 24/34  bone]
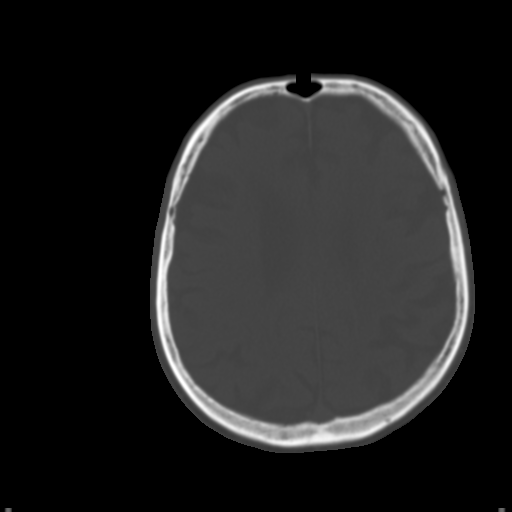
[im 29/34  brain]
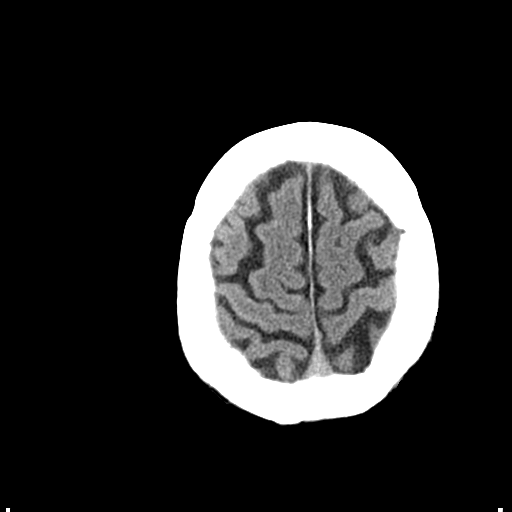

[Series 602: <mpr thick range> · axial · 0.49mm/px · z∈[+1594,+1680]mm · 5 of 30 slices shown]
[im 5/30  brain]
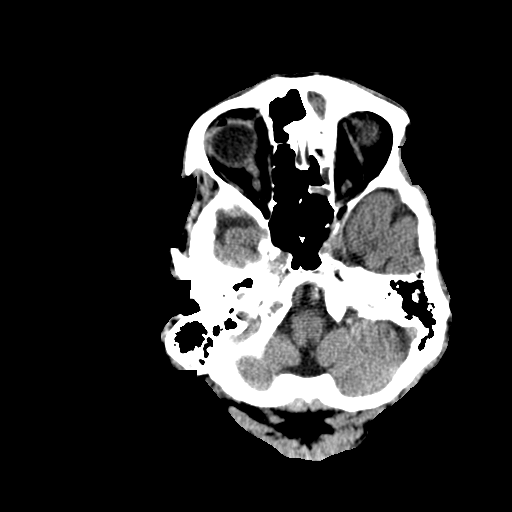
[im 10/30  brain]
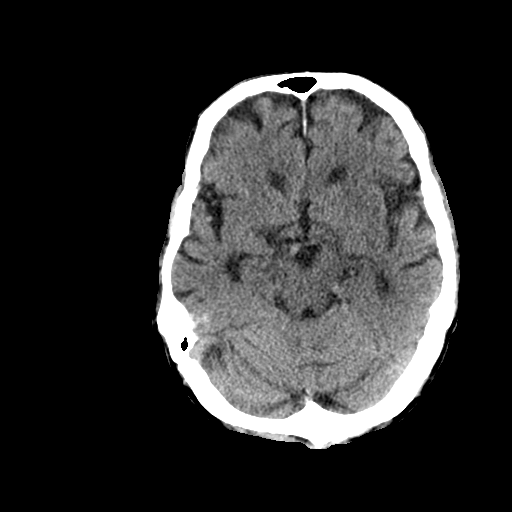
[im 15/30  brain]
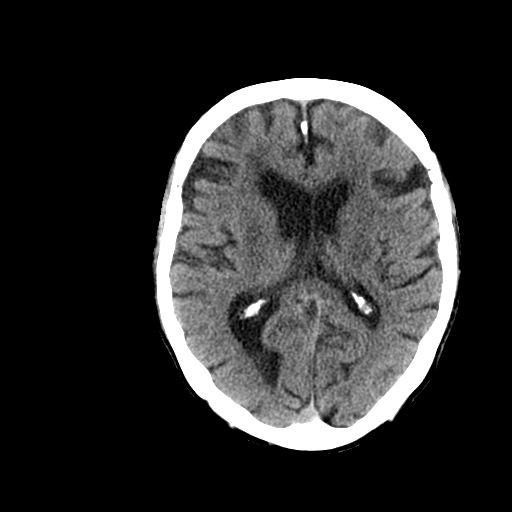
[im 20/30  brain]
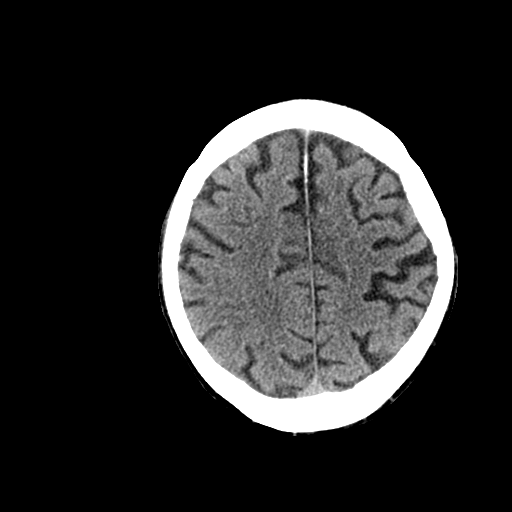
[im 25/30  brain]
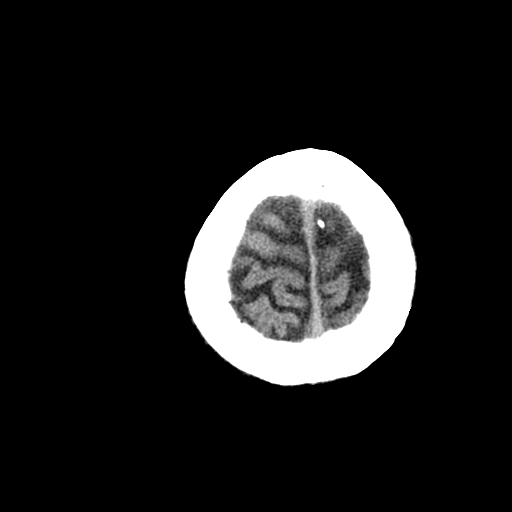

[Series 603: <mpr thick range(1)> · axial · 0.49mm/px · z∈[+1583,+1670]mm · 5 of 32 slices shown]
[im 6/32  brain]
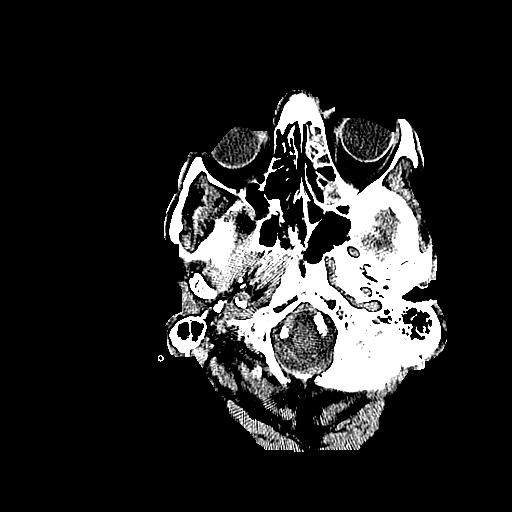
[im 11/32  brain]
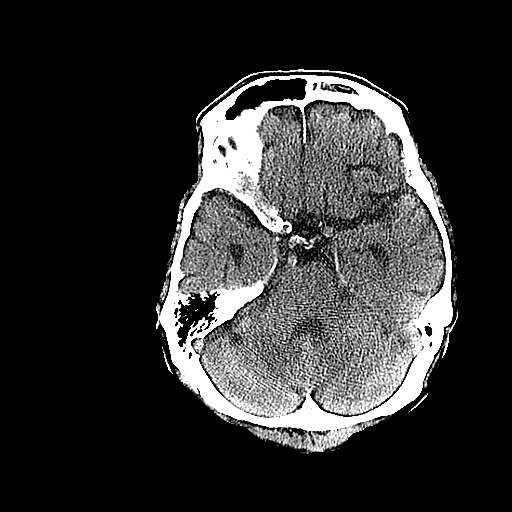
[im 16/32  brain]
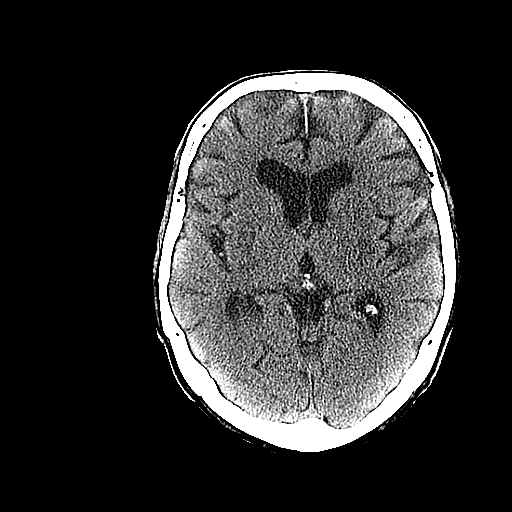
[im 21/32  brain]
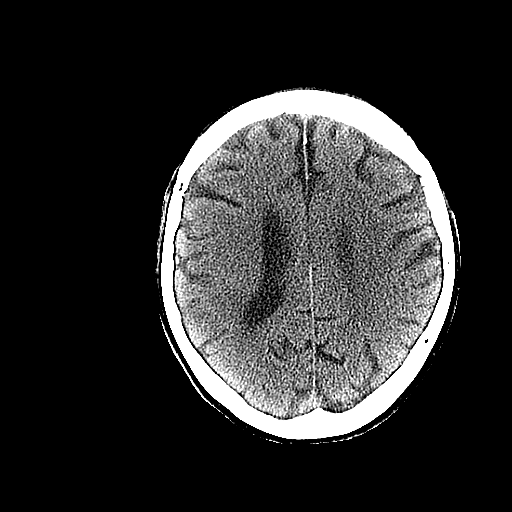
[im 26/32  brain]
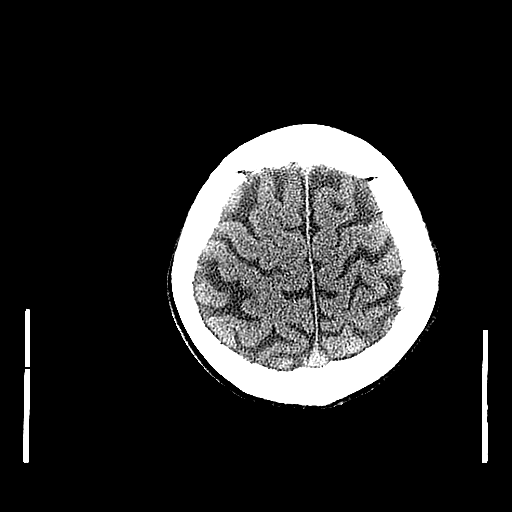

[16 of 30 positions shown; findings below may reference images not displayed]

FINDINGS: The ventricles and sulci are normal for age. No intraparenchymal
hemorrhage, mass effect nor midline shift. Patchy supratentorial
white matter hypodensities are less than expected for patient's age
and though non-specific suggest sequelae of chronic small vessel
ischemic disease. No acute large vascular territory infarcts. Patchy
hypodensities in the bilateral basal ganglia likely reflect
perivascular spaces and or lacunar infarcts.

No abnormal extra-axial fluid collections. Basal cisterns are
patent. Moderate calcific atherosclerosis of the carotid siphons.

No skull fracture. The included ocular globes and orbital contents
are non-suspicious. Status post bilateral ocular lens implants. Soft
tissue completely opacifies the LEFT maxilla with chronic bony wall
thickening. LEFT frontal ethmoidal sinusitis. LEFT maxillary
periapical abscess.
IMPRESSION: No acute intracranial process.

Involutional changes. Mild white matter changes, less than expected
for age. Bilateral basal ganglia perivascular spaces and/or lacunar
infarcts.

LEFT paranasal sinusitis, considering LEFT maxillary periapical
abscess, this may be in part odontogenic in origin.

  By: Lais Larios

## 2015-10-27 DEATH — deceased
